# Patient Record
Sex: Male | Born: 1965 | Race: Black or African American | Hispanic: No | Marital: Married | Smoking: Never smoker
Health system: Southern US, Community
[De-identification: ages and names within clinical notes are randomized; demographics above are authoritative.]

## PROBLEM LIST (undated history)

## (undated) DIAGNOSIS — D649 Anemia, unspecified: Secondary | ICD-10-CM

## (undated) DIAGNOSIS — G8929 Other chronic pain: Secondary | ICD-10-CM

## (undated) DIAGNOSIS — T7840XA Allergy, unspecified, initial encounter: Secondary | ICD-10-CM

## (undated) DIAGNOSIS — IMO0002 Reserved for concepts with insufficient information to code with codable children: Secondary | ICD-10-CM

## (undated) DIAGNOSIS — R51 Headache: Secondary | ICD-10-CM

## (undated) DIAGNOSIS — K219 Gastro-esophageal reflux disease without esophagitis: Secondary | ICD-10-CM

## (undated) DIAGNOSIS — R519 Headache, unspecified: Secondary | ICD-10-CM

## (undated) HISTORY — DX: Other chronic pain: G89.29

## (undated) HISTORY — DX: Anemia, unspecified: D64.9

## (undated) HISTORY — DX: Reserved for concepts with insufficient information to code with codable children: IMO0002

## (undated) HISTORY — DX: Allergy, unspecified, initial encounter: T78.40XA

## (undated) HISTORY — PX: OTHER SURGICAL HISTORY: SHX169

## (undated) HISTORY — DX: Gastro-esophageal reflux disease without esophagitis: K21.9

## (undated) HISTORY — DX: Headache: R51

## (undated) HISTORY — DX: Headache, unspecified: R51.9

---

## 2008-11-19 ENCOUNTER — Encounter: Admission: RE | Admit: 2008-11-19 | Discharge: 2008-11-19 | Payer: Self-pay | Admitting: Internal Medicine

## 2011-01-27 ENCOUNTER — Encounter (INDEPENDENT_AMBULATORY_CARE_PROVIDER_SITE_OTHER): Payer: BC Managed Care – PPO | Admitting: Family Medicine

## 2011-01-27 ENCOUNTER — Other Ambulatory Visit: Payer: BC Managed Care – PPO

## 2011-01-27 DIAGNOSIS — N400 Enlarged prostate without lower urinary tract symptoms: Secondary | ICD-10-CM

## 2011-01-27 DIAGNOSIS — Z0000 Encounter for general adult medical examination without abnormal findings: Secondary | ICD-10-CM

## 2011-01-27 DIAGNOSIS — K219 Gastro-esophageal reflux disease without esophagitis: Secondary | ICD-10-CM

## 2011-02-18 ENCOUNTER — Ambulatory Visit (INDEPENDENT_AMBULATORY_CARE_PROVIDER_SITE_OTHER): Payer: BC Managed Care – PPO

## 2011-02-18 DIAGNOSIS — R05 Cough: Secondary | ICD-10-CM

## 2011-02-18 DIAGNOSIS — J029 Acute pharyngitis, unspecified: Secondary | ICD-10-CM

## 2011-02-18 DIAGNOSIS — R059 Cough, unspecified: Secondary | ICD-10-CM

## 2011-03-04 ENCOUNTER — Other Ambulatory Visit (INDEPENDENT_AMBULATORY_CARE_PROVIDER_SITE_OTHER): Payer: BC Managed Care – PPO

## 2011-03-04 DIAGNOSIS — D709 Neutropenia, unspecified: Secondary | ICD-10-CM

## 2011-05-04 ENCOUNTER — Telehealth: Payer: Self-pay

## 2011-05-04 NOTE — Telephone Encounter (Signed)
Med refill on protonic - trying for three days to get a refill  Call [phone]

## 2011-05-05 MED ORDER — PANTOPRAZOLE SODIUM 40 MG PO TBEC: 40.0000 mg | DELAYED_RELEASE_TABLET | Freq: Every day | ORAL | Status: DC

## 2011-05-05 NOTE — Telephone Encounter (Signed)
Sending Rx into [facility] pharmacy we have record of pt using) and LMOM that I am sending RFs there and to CB if he has any ?s or needed it to go to a different pharmacy.

## 2011-12-21 ENCOUNTER — Ambulatory Visit (INDEPENDENT_AMBULATORY_CARE_PROVIDER_SITE_OTHER): Payer: BC Managed Care – PPO | Admitting: Family Medicine

## 2011-12-21 VITALS — BP 118/80 | HR 78 | Temp 98.4°F | Resp 16 | Ht 70.0 in | Wt 168.2 lb

## 2011-12-21 DIAGNOSIS — H00019 Hordeolum externum unspecified eye, unspecified eyelid: Secondary | ICD-10-CM

## 2011-12-21 DIAGNOSIS — H00012 Hordeolum externum right lower eyelid: Secondary | ICD-10-CM

## 2011-12-21 NOTE — Progress Notes (Signed)
[facility] [address] [place] [address] [phone]  Date:  12/21/2011   Name:  Gary Dean   DOB:  [birth year]   MRN:  [id number]  PCP:  Tally Due, MD    Chief Complaint: Eye Pain   History of Present Illness:  Gary Dean is a 46 y.o. very pleasant male patient who presents with the following:  Today is Wednesday. On Monday evening he noted some discomfort in his right lower eye lid.  He did not see anything out of the ordinary until yesterday when it began to swell. The pain in his lid has gotten worse so he decided to be seen.   He has noted redness in the lower right conjunctival sac.   No crusting or discharge on his eye.   He wears glasses but not contacts- vision seems to be ok.   No unusual HA, vomiting, or photophobia  He is generally very healthy.    He has tried some eye drops that his wife had left over from a previous problem- tobramycin drops  There is no problem list on file for this patient.   Past Medical History  Diagnosis Date  . Allergy   . Anemia     No past surgical history on file.  History  Substance Use Topics  . Smoking status: Never Smoker   . Smokeless tobacco: Not on file  . Alcohol Use: Not on file    Family History  Problem Relation Age of Onset  . Asthma Mother   . Hypertension Mother     No Known Allergies  Medication list has been reviewed and updated.  Current Outpatient Prescriptions on File Prior to Visit  Medication Sig Dispense Refill  . cetirizine (ZYRTEC) 10 MG tablet Take 10 mg by mouth daily.      . pantoprazole (PROTONIX) 40 MG tablet Take 1 tablet (40 mg total) by mouth daily.  30 tablet  3    Review of Systems:  As per HPI- otherwise negative.   Physical Examination: Filed Vitals:   12/21/11 0813  BP: 118/80  Pulse: 78  Temp: 98.4 F (36.9 C)  Resp: 16   Filed Vitals:   12/21/11 0813  Height: 5\' 10"  (1.778 m)  Weight: 168 lb 3.2 oz (76.295 kg)   Body  mass index is 24.13 kg/(m^2). Ideal Body Weight: Weight in (lb) to have BMI = 25: 173.9    GEN: WDWN, NAD, Non-toxic, Alert & Oriented x 3, looks well HEENT: Atraumatic, Normocephalic.  Bilateral TM wnl, oropharynx normal.  PEERL,EOMI.   Right lower lid is slightly swollen with a visible stye head inside the lid.  Globe is non- tender, fundoscopic exam normal.  No pain with movement of eye in all directions.   Ears and Nose: No external deformity. EXTR: No clubbing/cyanosis/edema NEURO: Normal gait.  PSYCH: Normally interactive. Conversant. Not depressed or anxious appearing.  Calm demeanor.    Assessment and Plan: 1. Hordeolum externum of right lower eyelid    Counseled re: conservative treatment of stye.  He has some left-over tobramycin drops which he can use up to every 4 hours as needed.  Let me know if not better in the next couple of days- Sooner if worse.   Abbe Amsterdam, MD

## 2011-12-21 NOTE — Patient Instructions (Addendum)
Let me know if you are not better in the next couple of days- Sooner if worse.     Sty A sty (hordeolum) is an infection of a gland in the eyelid located at the base of the eyelash. A sty may develop a white or yellow head of pus. It can be puffy (swollen). Usually, the sty will burst and pus will come out on its own. They do not leave lumps in the eyelid once they drain. A sty is often confused with another form of cyst of the eyelid called a chalazion. Chalazions occur within the eyelid and not on the edge where the bases of the eyelashes are. They often are red, sore and then form firm lumps in the eyelid. CAUSES   Germs (bacteria).  Lasting (chronic) eyelid inflammation. SYMPTOMS   Tenderness, redness and swelling along the edge of the eyelid at the base of the eyelashes.  Sometimes, there is a white or yellow head of pus. It may or may not drain. DIAGNOSIS  An ophthalmologist will be able to distinguish between a sty and a chalazion and treat the condition appropriately.  TREATMENT   Styes are typically treated with warm packs (compresses) until drainage occurs.  In rare cases, medicines that kill germs (antibiotics) may be prescribed. These antibiotics may be in the form of drops, cream or pills.  If a hard lump has formed, it is generally necessary to do a small incision and remove the hardened contents of the cyst in a minor surgical procedure done in the office.  In suspicious cases, your caregiver may send the contents of the cyst to the lab to be certain that it is not a rare, but dangerous form of cancer of the glands of the eyelid. HOME CARE INSTRUCTIONS   Wash your hands often and dry them with a clean towel. Avoid touching your eyelid. This may spread the infection to other parts of the eye.  Apply heat to your eyelid for 10 to 20 minutes, several times a day, to ease pain and help to heal it faster.  Do not squeeze the sty. Allow it to drain on its own. Wash your  eyelid carefully 3 to 4 times per day to remove any pus. SEEK IMMEDIATE MEDICAL CARE IF:   Your eye becomes painful or puffy (swollen).  Your vision changes.  Your sty does not drain by itself within 3 days.  Your sty comes back within a short period of time, even with treatment.  You have redness (inflammation) around the eye.  You have a fever. Document Released: 11/17/2004 Document Revised: 05/02/2011 Document Reviewed: 07/22/2008 [facility] Patient Information 2013 [place], [place].

## 2012-01-06 ENCOUNTER — Ambulatory Visit (INDEPENDENT_AMBULATORY_CARE_PROVIDER_SITE_OTHER): Payer: BC Managed Care – PPO | Admitting: Family Medicine

## 2012-01-06 VITALS — BP 120/76 | HR 86 | Temp 98.3°F | Resp 16 | Ht 70.0 in | Wt 166.8 lb

## 2012-01-06 DIAGNOSIS — R42 Dizziness and giddiness: Secondary | ICD-10-CM

## 2012-01-06 DIAGNOSIS — R519 Headache, unspecified: Secondary | ICD-10-CM

## 2012-01-06 DIAGNOSIS — R51 Headache: Secondary | ICD-10-CM

## 2012-01-06 NOTE — Progress Notes (Signed)
[facility]:  Office Visit  Chief Complaint:  Chief Complaint  Patient presents with  . Dizziness    got up to use the bathroom this morning was off balance and dizzy    HPI: Gary Dean is a 46 y.o. male who complains of  Off balance and dizzy when got out of bed this AM, with right sided pressure, dull ache described as  light sense of something . He has a h/o HA but not like this. Dizziness lasted for about 20 minutes.  No prior episodes. Denies vision changes, CP, SOB, palpitations, diaphoresis, confusion, LOC, gait changes, nause, vomting, abd pain., numbness, tingling, jaw pain. He did not have worsening sxs after urination. Able to get off toilet ok without increasing dizziness or LOC. Deneis URI sxs, inner ear issues.   Past Medical History  Diagnosis Date  . Allergy   . Anemia   . Ulcer   . Chronic headaches     3-4 times a day   Past Surgical History  Procedure Date  . Left knee arthroscopy    History   Social History  . Marital Status: Married    Spouse Name: N/A    Number of Children: N/A  . Years of Education: N/A   Social History Main Topics  . Smoking status: Never Smoker   . Smokeless tobacco: None  . Alcohol Use: None  . Drug Use: None  . Sexually Active: None   Other Topics Concern  . None   Social History Narrative  . None   Family History  Problem Relation Age of Onset  . Asthma Mother   . Hypertension Mother    No Known Allergies Prior to Admission medications   Medication Sig Start Date End Date Taking? Authorizing Provider  cetirizine (ZYRTEC) 10 MG tablet Take 10 mg by mouth daily.   Yes Historical Provider, MD  pantoprazole (PROTONIX) 40 MG tablet Take 1 tablet (40 mg total) by mouth daily. 05/05/11 05/04/12 Yes Maurice March, MD     ROS: The patient denies fevers, chills, night sweats, unintentional weight loss, chest pain, palpitations, wheezing, dyspnea on exertion, nausea, vomiting, abdominal pain,  dysuria, hematuria, melena, numbness, weakness, or tingling.   All other systems have been reviewed and were otherwise negative with the exception of those mentioned in the HPI and as above.    PHYSICAL EXAM: Filed Vitals:   01/06/12 0842  BP: 120/76  Pulse: 86  Temp: 98.3 F (36.8 C)  Resp: 16   Filed Vitals:   01/06/12 0842  Height: 5\' 10"  (1.778 m)  Weight: 166 lb 12.8 oz (75.66 kg)   Body mass index is 23.93 kg/(m^2). Laying down 112/76, 72; 108/76, 68; 112/78,   General: Alert, no acute distress HEENT:  Normocephalic, atraumatic, oropharynx patent. EOMI, PERRLA, fundoscopic exam nl. TM nl.  Cardiovascular:  Regular rate and rhythm, no rubs murmurs or gallops.  No Carotid bruits, radial pulse intact. No pedal edema.  Respiratory: Clear to auscultation bilaterally.  No wheezes, rales, or rhonchi.  No cyanosis, no use of accessory musculature GI: No organomegaly, abdomen is soft and non-tender, positive bowel sounds.  No masses. Skin: No rashes. Neurologic: Facial musculature symmetric. CN 2-12 grossly normal Psychiatric: Patient is appropriate throughout our interaction. Lymphatic: No cervical lymphadenopathy Musculoskeletal: Gait intact. Cerebellar fxn intact   LABS: No results found for this or any previous visit.   EKG/XRAY:   Primary read interpreted by Dr. Conley Rolls at [facility].   ASSESSMENT/PLAN:  Encounter Diagnosis  Name Primary?  . 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  ? Postural related vs other pre-syncope vasovagal triggers ie stress, dehydration. Orthostatics normal. Patient declined CBC, glucose. Will check next time if reoccurs.  Not likely solely neurogenic ie brain mass, cardiac arrhythmia or infectious related Continue to monitor for sxs. Get up slowly when rising from bed early in AM F/u with worsening sxs, would consider EKG and cardiac follow-up    Deyanira Fesler PHUONG,  DO 01/06/2012 9:23 AM

## 2012-01-06 NOTE — Patient Instructions (Signed)
Dizziness  Dizziness is a common problem. It is a feeling of unsteadiness or lightheadedness. You may feel like you are about to faint. Dizziness can lead to injury if you stumble or fall. A person of any age group can suffer from dizziness, but dizziness is more common in older adults.  CAUSES    Dizziness can be caused by many different things, including:   Middle ear problems.   Standing for too long.   Infections.   An allergic reaction.   Aging.   An emotional response to something, such as the sight of blood.   Side effects of medicines.   Fatigue.   Problems with circulation or blood pressure.   Excess use of alcohol, medicines, or illegal drug use.   Breathing too fast (hyperventilation).   An arrhythmia or problems with your heart rhythm.   Low red blood cell count (anemia).   Pregnancy.   Vomiting, diarrhea, fever, or other illnesses that cause dehydration.   Diseases or conditions such as Parkinson's disease, high blood pressure (hypertension), diabetes, and thyroid problems.   Exposure to extreme heat.  DIAGNOSIS    To find the cause of your dizziness, your caregiver may do a physical exam, lab tests, radiologic imaging scans, or an electrocardiography test (ECG).    TREATMENT    Treatment of dizziness depends on the cause of your symptoms and can vary greatly.  HOME CARE INSTRUCTIONS     Drink enough fluids to keep your urine clear or pale yellow. This is especially important in very hot weather. In the elderly, it is also important in cold weather.   If your dizziness is caused by medicines, take them exactly as directed. When taking blood pressure medicines, it is especially important to get up slowly.   Rise slowly from chairs and steady yourself until you feel okay.   In the morning, first sit up on the side of the bed. When this seems okay, stand slowly while holding onto something until you know your balance is fine.    If you need to stand in one place for a long time, be sure to move your legs often. Tighten and relax the muscles in your legs while standing.   If dizziness continues to be a problem, have someone stay with you for a day or two. Do this until you feel you are well enough to stay alone. Have the person call your caregiver if he or she notices changes in you that are concerning.   Do not drive or use heavy machinery if you feel dizzy.   Do not drink alcohol.  SEEK IMMEDIATE MEDICAL CARE IF:     Your dizziness or lightheadedness gets worse.   You feel nauseous or vomit.   You develop problems with talking, walking, weakness, or using your arms, hands, or legs.   You are not thinking clearly or you have difficulty forming sentences. It may take a friend or family member to determine if your thinking is normal.   You develop chest pain, abdominal pain, shortness of breath, or sweating.   Your vision changes.   You notice any bleeding.   You have side effects from medicine that seems to be getting worse rather than better.  MAKE SURE YOU:     Understand these instructions.   Will watch your condition.   Will get help right away if you are not doing well or get worse.  Document Released: 08/03/2000 Document Revised: 05/02/2011 Document Reviewed: 08/27/2010  ExitCare

## 2012-02-10 ENCOUNTER — Ambulatory Visit (INDEPENDENT_AMBULATORY_CARE_PROVIDER_SITE_OTHER): Payer: BC Managed Care – PPO | Admitting: Family Medicine

## 2012-02-10 VITALS — BP 118/80 | HR 81 | Temp 98.1°F | Resp 16 | Ht 69.0 in | Wt 164.0 lb

## 2012-02-10 DIAGNOSIS — Z23 Encounter for immunization: Secondary | ICD-10-CM

## 2012-02-10 DIAGNOSIS — Z293 Encounter for prophylactic fluoride administration: Secondary | ICD-10-CM

## 2012-02-10 DIAGNOSIS — Z0000 Encounter for general adult medical examination without abnormal findings: Secondary | ICD-10-CM

## 2012-02-10 LAB — COMPREHENSIVE METABOLIC PANEL
AST: 21 U/L (ref 0–37)
BUN: 13 mg/dL (ref 6–23)
CO2: 27 mEq/L (ref 19–32)
Calcium: 10.1 mg/dL (ref 8.4–10.5)
Chloride: 104 mEq/L (ref 96–112)
Creat: 1.02 mg/dL (ref 0.50–1.35)

## 2012-02-10 LAB — POCT CBC
MCH, POC: 28.3 pg (ref 27–31.2)
MCV: 91.2 fL (ref 80–97)
MID (cbc): 0.4 (ref 0–0.9)
Platelet Count, POC: 192 10*3/uL (ref 142–424)
RBC: 5.59 M/uL (ref 4.69–6.13)
WBC: 3.7 10*3/uL — AB (ref 4.6–10.2)

## 2012-02-10 LAB — LIPID PANEL
Cholesterol: 163 mg/dL (ref 0–200)
HDL: 48 mg/dL (ref >39)
Total CHOL/HDL Ratio: 3.4 Ratio
Triglycerides: 96 mg/dL (ref <150)

## 2012-02-10 LAB — POCT URINALYSIS DIPSTICK
Bilirubin, UA: NEGATIVE
Glucose, UA: NEGATIVE
Ketones, UA: NEGATIVE
Leukocytes, UA: NEGATIVE
Protein, UA: NEGATIVE

## 2012-02-10 MED ORDER — KETOCONAZOLE 2 % EX SHAM: MEDICATED_SHAMPOO | CUTANEOUS | Status: DC

## 2012-02-10 MED ORDER — PANTOPRAZOLE SODIUM 40 MG PO TBEC: 40.0000 mg | DELAYED_RELEASE_TABLET | Freq: Every day | ORAL | Status: DC

## 2012-02-10 NOTE — Progress Notes (Signed)
Complete physical examination  History: Patient is here for his annual physical examination. No particular new problems. Images time for him to get a good checkup.  Past medical history: He's had problems with allergies which he intermittently treat with Zyrtec. He has a history of ulcers, which leads him to take some medicine for stomach or apparent basis now when he noticed his symptoms. He does have a remote history of anemia but not backward he is in college. That is not an ongoing problem.  Previous surgeries: He had knee surgery to remove some bone fragments. No other major operations  Allergies: None Current medications: Pantoprazole daily when necessary  Zyrtec daily when necessary  Family history: Parents and siblings are all living there is family history for osteoporosis, hypertension, hyperlipidemia, and asthma.  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. He is married, has several children. A 44 year old still at home. He does not smoke drink or use drugs. He exercises several times a week by walking. He is originally from [place]. Review of systems: Constitutional: Unremarkable HEENT: Unremarkable Eyes: Unremarkable Respiratory: Unremarkable Cardiovascular: Unremarkable GI: Occasional epigastric pain he is taking his medication again GU: Unremarkable Muscle skeletal: Unremarkable Dermatologic: Has had problems of his scalp and would like some more Nizoral shampoo to be used use. Neurologic unremarkable  Physical exam: Well-developed well-nourished man in no acute distress. TMs normal. Wears glasses. Eyes PERRLA. Fundi benign with discs flat. Throat clear. Neck supple without nodes thyromegaly. Chest clear. Heart regular without murmurs gallops or arrhythmias. Abdomen soft without masses tenderness. Normal male external genitalia uncircumcised. Testes descended. No hernias. No axillary inguinal nodes.  Extremities unremarkable. Skin unremarkable. Good pedal pulses.  Assessment: Complete physical examination History of peptic ulcer disease Seborrheic dermatitis of scalp History of allergic rhinitis   Plan: Check EKG UA PSA Hemoccult complete metabolic panel and CBC Refills of his medications  Results for orders placed in visit on 02/10/12  POCT URINALYSIS DIPSTICK      Component Value Range   Color, UA dark yellow     Clarity, UA clear     Glucose, UA neg     Bilirubin, UA neg     Ketones, UA neg     Spec Grav, UA 1.020     Blood, UA neg     pH, UA 7.0     Protein, UA neg     Urobilinogen, UA 1.0     Nitrite, UA neg     Leukocytes, UA Negative    POCT CBC      Component Value Range   WBC 3.7 (*) 4.6 - 10.2 K/uL   Lymph, poc 1.7  0.6 - 3.4   POC LYMPH PERCENT 47.0  10 - 50 %L   MID (cbc) 0.4  0 - 0.9   POC MID % 10.1  0 - 12 %M   POC Granulocyte 1.6 (*) 2 - 6.9   Granulocyte percent 42.9  37 - 80 %G   RBC 5.59  4.69 - 6.13 M/uL   Hemoglobin 15.8  14.1 - 18.1 g/dL   HCT, POC 45.4  09.8 - 53.7 %   MCV 91.2  80 - 97 fL   MCH, POC 28.3  27 - 31.2 pg   MCHC 31.0 (*) 31.8 - 35.4 g/dL   RDW, POC 11.9     Platelet Count, POC 192  142 - 424 K/uL   MPV 12.0  0 - 99.8 fL  IFOBT (OCCULT BLOOD)      Component Value Range   IFOBT Negative

## 2012-02-10 NOTE — Patient Instructions (Signed)
We will let you know the results of the remainder of your laboratory data.  Return if further problems arise.

## 2012-02-12 ENCOUNTER — Encounter: Payer: Self-pay | Admitting: *Deleted

## 2012-06-07 ENCOUNTER — Telehealth: Payer: Self-pay

## 2012-06-07 NOTE — Telephone Encounter (Signed)
Pt is calling because he is in need of pre authorization for some medication. He states that there should already be something faxed to [place], he is wanting to check  And see how its going, Call back number is [phone]

## 2012-06-07 NOTE — Telephone Encounter (Signed)
LMOM for pt that we have not received anything from pharmacy concerning a PA needed. Asked for him to call his pharmacy and have them fax [place] info, or to call and let us know what he needs.

## 2012-06-07 NOTE — Telephone Encounter (Signed)
Pt CB and reported that he needs a PA on his protonix. His [facility] plan ID is [phone]. He states he has used Zantac when his Sxs are not as bad and has tried/failed Prevacid in the past. He was put on protonix 8 years ago after gastric ulcer and has had good effectiveness from it.

## 2012-06-08 NOTE — Telephone Encounter (Signed)
Tried to complete PA online. It would not process online but will send fax to complete. Awaiting form.

## 2012-06-13 NOTE — Progress Notes (Signed)
PA approved for pantoprazole through 06/12/13. Faxed to pharmacy.

## 2012-06-20 NOTE — Telephone Encounter (Signed)
PA approved see "documentation" notes.

## 2013-01-28 ENCOUNTER — Ambulatory Visit (INDEPENDENT_AMBULATORY_CARE_PROVIDER_SITE_OTHER): Payer: BC Managed Care – PPO | Admitting: Family Medicine

## 2013-01-28 VITALS — BP 110/80 | HR 89 | Temp 98.6°F | Resp 16 | Ht 70.0 in | Wt 168.8 lb

## 2013-01-28 DIAGNOSIS — Z8711 Personal history of peptic ulcer disease: Secondary | ICD-10-CM

## 2013-01-28 DIAGNOSIS — L219 Seborrheic dermatitis, unspecified: Secondary | ICD-10-CM

## 2013-01-28 DIAGNOSIS — L218 Other seborrheic dermatitis: Secondary | ICD-10-CM

## 2013-01-28 DIAGNOSIS — Z0000 Encounter for general adult medical examination without abnormal findings: Secondary | ICD-10-CM

## 2013-01-28 LAB — POCT CBC
Granulocyte percent: 42.2 %G (ref 37–80)
HCT, POC: 48.9 % (ref 43.5–53.7)
Hemoglobin: 15.2 g/dL (ref 14.1–18.1)
Lymph, poc: 1.7 (ref 0.6–3.4)
MCH, POC: 28.4 pg (ref 27–31.2)
MCHC: 31.1 g/dL — AB (ref 31.8–35.4)
MCV: 91.2 fL (ref 80–97)
MID (cbc): 0.4 (ref 0–0.9)
MPV: 10.8 fL (ref 0–99.8)
POC Granulocyte: 1.5 — AB (ref 2–6.9)
POC LYMPH PERCENT: 47 %L (ref 10–50)
POC MID %: 10.8 %M (ref 0–12)
Platelet Count, POC: 166 10*3/uL (ref 142–424)
RBC: 5.36 M/uL (ref 4.69–6.13)
RDW, POC: 11.9 %
WBC: 3.6 10*3/uL — AB (ref 4.6–10.2)

## 2013-01-28 LAB — POCT URINALYSIS DIPSTICK
Bilirubin, UA: NEGATIVE
Glucose, UA: NEGATIVE
Ketones, UA: NEGATIVE
Leukocytes, UA: NEGATIVE
Nitrite, UA: NEGATIVE
Protein, UA: 30
Spec Grav, UA: 1.02
Urobilinogen, UA: 1
pH, UA: 7

## 2013-01-28 LAB — LIPID PANEL
Cholesterol: 174 mg/dL (ref 0–200)
HDL: 49 mg/dL (ref >39)
LDL Cholesterol: 109 mg/dL — ABNORMAL HIGH (ref 0–99)
Total CHOL/HDL Ratio: 3.6 Ratio
Triglycerides: 82 mg/dL (ref <150)
VLDL: 16 mg/dL (ref 0–40)

## 2013-01-28 LAB — IFOBT (OCCULT BLOOD): IFOBT: NEGATIVE

## 2013-01-28 LAB — PSA: PSA: 3.25 ng/mL (ref <=4.00)

## 2013-01-28 MED ORDER — KETOCONAZOLE 2 % EX SHAM: MEDICATED_SHAMPOO | CUTANEOUS | Status: DC

## 2013-01-28 MED ORDER — PANTOPRAZOLE SODIUM 40 MG PO TBEC: 40.0000 mg | DELAYED_RELEASE_TABLET | Freq: Every day | ORAL | Status: DC

## 2013-01-28 NOTE — Patient Instructions (Signed)
Please check your records for last tetanus shot.  We have no record of one being given here  Health Maintenance, Males A healthy lifestyle and preventative care can promote health and wellness.  Maintain regular health, dental, and eye exams.  Eat a healthy diet. Foods like vegetables, fruits, whole grains, low-fat dairy products, and lean protein foods contain the nutrients you need without too many calories. Decrease your intake of foods high in solid fats, added sugars, and salt. Get information about a proper diet from your caregiver, if necessary.  Regular physical exercise is one of the most important things you can do for your health. Most adults should get at least 150 minutes of moderate-intensity exercise (any activity that increases your heart rate and causes you to sweat) each week. In addition, most adults need muscle-strengthening exercises on 2 or more days a week.   Maintain a healthy weight. The body mass index (BMI) is a screening tool to identify possible weight problems. It provides an estimate of body fat based on height and weight. Your caregiver can help determine your BMI, and can help you achieve or maintain a healthy weight. For adults 20 years and older:  A BMI below 18.5 is considered underweight.  A BMI of 18.5 to 24.9 is normal.  A BMI of 25 to 29.9 is considered overweight.  A BMI of 30 and above is considered obese.  Maintain normal blood lipids and cholesterol by exercising and minimizing your intake of saturated fat. Eat a balanced diet with plenty of fruits and vegetables. Blood tests for lipids and cholesterol should begin at age 67 and be repeated every 5 years. If your lipid or cholesterol levels are high, you are over 50, or you are a high risk for heart disease, you may need your cholesterol levels checked more frequently.Ongoing high lipid and cholesterol levels should be treated with medicines, if diet and exercise are not effective.  If you smoke,  find out from your caregiver how to quit. If you do not use tobacco, do not start.  Lung cancer screening is recommended for adults aged 71 80 years who are at high risk for developing lung cancer because of a history of smoking. Yearly low-dose computed tomography (CT) is recommended for people who have at least a 30-pack-year history of smoking and are a current smoker or have quit within the past 15 years. A pack year of smoking is smoking an average of 1 pack of cigarettes a day for 1 year (for example: 1 pack a day for 30 years or 2 packs a day for 15 years). Yearly screening should continue until the smoker has stopped smoking for at least 15 years. Yearly screening should also be stopped for people who develop a health problem that would prevent them from having lung cancer treatment.  If you choose to drink alcohol, do not exceed 2 drinks per day. One drink is considered to be 12 ounces (355 mL) of beer, 5 ounces (148 mL) of wine, or 1.5 ounces (44 mL) of liquor.  Avoid use of street drugs. Do not share needles with anyone. Ask for help if you need support or instructions about stopping the use of drugs.  High blood pressure causes heart disease and increases the risk of stroke. Blood pressure should be checked at least every 1 to 2 years. Ongoing high blood pressure should be treated with medicines if weight loss and exercise are not effective.  If you are 50 to 45 years old,  ask your caregiver if you should take aspirin to prevent heart disease.  Diabetes screening involves taking a blood sample to check your fasting blood sugar level. This should be done once every 3 years, after age 42, if you are within normal weight and without risk factors for diabetes. Testing should be considered at a younger age or be carried out more frequently if you are overweight and have at least 1 risk factor for diabetes.  Colorectal cancer can be detected and often prevented. Most routine colorectal cancer  screening begins at the age of 26 and continues through age 72. However, your caregiver may recommend screening at an earlier age if you have risk factors for colon cancer. On a yearly basis, your caregiver may provide home test kits to check for hidden blood in the stool. Use of a small camera at the end of a tube, to directly examine the colon (sigmoidoscopy or colonoscopy), can detect the earliest forms of colorectal cancer. Talk to your caregiver about this at age 57, when routine screening begins. Direct examination of the colon should be repeated every 5 to 10 years through age 20, unless early forms of pre-cancerous polyps or small growths are found.  Hepatitis C blood testing is recommended for all people born from [birth year] through 1965 and any individual with known risks for hepatitis C.  Healthy men should no longer receive prostate-specific antigen (PSA) blood tests as part of routine cancer screening. Consult with your caregiver about prostate cancer screening.  Testicular cancer screening is not recommended for adolescents or adult males who have no symptoms. Screening includes self-exam, caregiver exam, and other screening tests. Consult with your caregiver about any symptoms you have or any concerns you have about testicular cancer.  Practice safe sex. Use condoms and avoid high-risk sexual practices to reduce the spread of sexually transmitted infections (STIs).  Use sunscreen. Apply sunscreen liberally and repeatedly throughout the day. You should seek shade when your shadow is shorter than you. Protect yourself by wearing long sleeves, pants, a wide-brimmed hat, and sunglasses year round, whenever you are outdoors.  Notify your caregiver of new moles or changes in moles, especially if there is a change in shape or color. Also notify your caregiver if a mole is larger than the size of a pencil eraser.  A one-time screening for abdominal aortic aneurysm (AAA) and surgical repair of large  AAAs by sound wave imaging (ultrasonography) is recommended for ages 69 to 39 years who are current or former smokers.  Stay current with your immunizations. Document Released: 08/06/2007 Document Revised: 06/04/2012 Document Reviewed: 07/05/2010 [facility] Patient Information 2014 [place], [place].

## 2013-01-28 NOTE — Progress Notes (Signed)
Patient ID: Gary Dean, male   DOB: August 09, 1965, 47 y.o.   MRN: [id number]  This chart was scribed for Elvina Sidle, MD by Caryn Bee, Medical Scribe. The patient's care was started at 8:50 AM.  Patient ID: Gary Dean MRN: [id number], DOB: 29-Mar-1965 47 y.o. Date of Encounter: 01/28/2013, 8:50 AM  Primary Physician: Tally Due, MD  Chief Complaint: Physical (CPE)  HPI 47 y.o. y/o male with history noted below here for CPE. 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 He is married with an 5 yr old child and a 46 yr old. His 82 yr old son lives on his own and works at [employer]. His daughter is applying to college currently. She currently goes to [employer], 4.6 GPA.  Pt had a stomach ulcer years ago that he takes medication for occasionally when he has a flare up. He is going to start exercising again in January. Pt is not a smoker. He is unsure when his last tetanus shot was. Pt went to [facility] and is originally from [place].  Doing well. No issues/complaints.:   Review of Systems: Consitutional: No fever, chills, fatigue, night sweats, lymphadenopathy, or weight changes. Eyes: No visual changes, eye redness, or discharge. ENT/Mouth: Ears: No otalgia, tinnitus, hearing loss, discharge. Nose: No congestion, rhinorrhea, sinus pain, or epistaxis. Throat: No sore throat, post nasal drip, or teeth pain. Cardiovascular: No CP, palpitations, diaphoresis, DOE, edema, orthopnea, PND. Respiratory: No cough, hemoptysis, SOB, or wheezing. Gastrointestinal: No anorexia, dysphagia, reflux, pain, nausea, vomiting, hematemesis, diarrhea, constipation, BRBPR, or melena. Genitourinary: No dysuria, frequency, urgency, hematuria, incontinence, nocturia, decreased urinary stream, discharge, impotence, or testicular pain/masses. Musculoskeletal: No decreased ROM, myalgias, stiffness, joint swelling, or weakness. Skin: No rash, erythema, lesion  changes, pain, warmth, jaundice, or pruritis. Neurological: No headache, dizziness, syncope, seizures, tremors, memory loss, coordination problems, or paresthesias. Psychological: No anxiety, depression, hallucinations, SI/HI. Endocrine: No fatigue, polydipsia, polyphagia, polyuria, or known diabetes. All other systems were reviewed and are otherwise negative.  Past Medical History  Diagnosis Date   Allergy    Anemia    Ulcer    Chronic headaches     3-4 times a day     Past Surgical History  Procedure Laterality Date   Left knee arthroscopy      Home Meds:  Prior to Admission medications   Medication Sig Start Date End Date Taking? Authorizing Provider  cetirizine (ZYRTEC) 10 MG tablet Take 10 mg by mouth daily.   Yes Historical Provider, MD  ketoconazole (NIZORAL) 2 % shampoo Apply topically 2 (two) times a week. 02/10/12  Yes Peyton Najjar, MD  pantoprazole (PROTONIX) 40 MG tablet Take 1 tablet (40 mg total) by mouth daily. 02/10/12 02/09/13 Yes Peyton Najjar, MD    Allergies: No Known Allergies  History   Social History   Marital Status: Married    Spouse Name: N/A    Number of Children: N/A   Years of Education: N/A   Occupational History   Not on file.   Social History Main Topics   Smoking status: Never Smoker    Smokeless tobacco: Not on file   Alcohol Use: No   Drug Use: No   Sexual Activity: Not on file   Other Topics Concern   Not on file   Social History Narrative   No narrative on file    Family History  Problem Relation Age of Onset   Asthma Mother  Hypertension Mother     Physical Exam: Blood pressure 110/80, pulse 89, temperature 98.6 F (37 C), temperature source Oral, resp. rate 16, height 5\' 10"  (1.778 m), weight 168 lb 12.8 oz (76.567 kg), SpO2 100.00%.  General: Well developed, well nourished, in no acute distress. HEENT: Normocephalic, atraumatic. Conjunctiva pink, sclera non-icteric. Pupils 2 mm constricting  to 1 mm, round, regular, and equally reactive to light and accomodation. EOMI. Internal auditory canal clear. TMs with good cone of light and without pathology. Nasal mucosa pink. Nares are without discharge. No sinus tenderness. Oral mucosa pink. Dentition good. Pharynx without exudate.   Neck: Supple. Trachea midline. No thyromegaly. Full ROM. No lymphadenopathy. Lungs: Clear to auscultation bilaterally without wheezes, rales, or rhonchi. Breathing is of normal effort and unlabored. Cardiovascular: RRR with S1 S2. No murmurs, rubs, or gallops appreciated. Distal pulses 2+ symmetrically. No carotid or abdominal bruits Abdomen: Soft, non-tender, non-distended with normoactive bowel sounds. No hepatosplenomegaly or masses. No rebound/guarding. No CVA tenderness. Without hernias.  Rectal: No external hemorrhoids or fissures. Rectal vault without masses.  Genitourinary:  circumcised male. No penile lesions. Testes descended bilaterally, and smooth without tenderness or masses.  Musculoskeletal: Full range of motion and 5/5 strength throughout. Without swelling, atrophy, tenderness, crepitus, or warmth. Extremities without clubbing, cyanosis, or edema. Calves supple. Skin: Warm and moist without erythema, ecchymosis, wounds, or rash. Neuro: A+Ox3. CN II-XII grossly intact. Moves all extremities spontaneously. Full sensation throughout. Normal gait. DTR 2+ throughout upper and lower extremities. Finger to nose intact. Psych:  Responds to questions appropriately with a normal affect.   Studies: CBC, CMET, Lipid, PSA, TSH, Vitamin D all pending.  Results for orders placed in visit on 01/28/13  POCT CBC      Result Value Range   WBC 3.6 (*) 4.6 - 10.2 K/uL   Lymph, poc 1.7  0.6 - 3.4   POC LYMPH PERCENT 47.0  10 - 50 %L   MID (cbc) 0.4  0 - 0.9   POC MID % 10.8  0 - 12 %M   POC Granulocyte 1.5 (*) 2 - 6.9   Granulocyte percent 42.2  37 - 80 %G   RBC 5.36  4.69 - 6.13 M/uL   Hemoglobin 15.2  14.1 -  18.1 g/dL   HCT, POC 40.9  81.1 - 53.7 %   MCV 91.2  80 - 97 fL   MCH, POC 28.4  27 - 31.2 pg   MCHC 31.1 (*) 31.8 - 35.4 g/dL   RDW, POC 91.4     Platelet Count, POC 166  142 - 424 K/uL   MPV 10.8  0 - 99.8 fL  IFOBT (OCCULT BLOOD)      Result Value Range   IFOBT Negative    POCT URINALYSIS DIPSTICK      Result Value Range   Color, UA yellow     Clarity, UA clear     Glucose, UA neg     Bilirubin, UA neg     Ketones, UA neg     Spec Grav, UA 1.020     Blood, UA trace-intact     pH, UA 7.0     Protein, UA 30     Urobilinogen, UA 1.0     Nitrite, UA neg     Leukocytes, UA Negative       Assessment/Plan:  47 y.o. y/o  male here for CPE -Routine general medical examination at a health care facility - Plan: POCT CBC, Lipid panel, PSA,  IFOBT POC (occult bld, rslt in office), POCT urinalysis dipstick  H/O peptic ulcer - Plan: pantoprazole (PROTONIX) 40 MG tablet  Seborrheic dermatitis of scalp - Plan: ketoconazole (NIZORAL) 2 % shampoo    Signed, Elvina Sidle, MD 01/28/2013 8:50 AM

## 2013-08-15 ENCOUNTER — Telehealth: Payer: Self-pay

## 2013-08-15 NOTE — Telephone Encounter (Signed)
PA needed for pantoprazole. Completed on phone. Approved through 08/15/14, case # [id number]. Notified pharm.

## 2013-08-19 ENCOUNTER — Telehealth: Payer: Self-pay | Admitting: *Deleted

## 2013-08-19 NOTE — Telephone Encounter (Signed)
PA has been approved for Pantoprazole until 08/15/14

## 2013-12-19 ENCOUNTER — Ambulatory Visit (INDEPENDENT_AMBULATORY_CARE_PROVIDER_SITE_OTHER): Payer: BC Managed Care – PPO | Admitting: Family Medicine

## 2013-12-19 VITALS — BP 122/72 | HR 83 | Temp 98.6°F | Resp 17 | Ht 71.0 in | Wt 170.0 lb

## 2013-12-19 DIAGNOSIS — H00016 Hordeolum externum left eye, unspecified eyelid: Secondary | ICD-10-CM

## 2013-12-19 MED ORDER — TOBRAMYCIN 0.3 % OP OINT: 1.0000 "application " | TOPICAL_OINTMENT | Freq: Three times a day (TID) | OPHTHALMIC | Status: DC

## 2013-12-19 NOTE — Patient Instructions (Addendum)
Both internal and external hordeola can be treated with warm compresses, placed off and on for about 15 minutes at a time approximately four times per day.  Apply ointment 2-3x/day   Sty A sty (hordeolum) is an infection of a gland in the eyelid located at the base of the eyelash. A sty may develop a white or yellow head of pus. It can be puffy (swollen). Usually, the sty will burst and pus will come out on its own. They do not leave lumps in the eyelid once they drain. A sty is often confused with another form of cyst of the eyelid called a chalazion. Chalazions occur within the eyelid and not on the edge where the bases of the eyelashes are. They often are red, sore and then form firm lumps in the eyelid. CAUSES   Germs (bacteria).  Lasting (chronic) eyelid inflammation. SYMPTOMS   Tenderness, redness and swelling along the edge of the eyelid at the base of the eyelashes.  Sometimes, there is a white or yellow head of pus. It may or may not drain. DIAGNOSIS  An ophthalmologist will be able to distinguish between a sty and a chalazion and treat the condition appropriately.  TREATMENT   Styes are typically treated with warm packs (compresses) until drainage occurs.  In rare cases, medicines that kill germs (antibiotics) may be prescribed. These antibiotics may be in the form of drops, cream or pills.  If a hard lump has formed, it is generally necessary to do a small incision and remove the hardened contents of the cyst in a minor surgical procedure done in the office.  In suspicious cases, your caregiver may send the contents of the cyst to the lab to be certain that it is not a rare, but dangerous form of cancer of the glands of the eyelid. HOME CARE INSTRUCTIONS   Wash your hands often and dry them with a clean towel. Avoid touching your eyelid. This may spread the infection to other parts of the eye.  Apply heat to your eyelid for 10 to 20 minutes, several times a day, to ease  pain and help to heal it faster.  Do not squeeze the sty. Allow it to drain on its own. Wash your eyelid carefully 3 to 4 times per day to remove any pus. SEEK IMMEDIATE MEDICAL CARE IF:   Your eye becomes painful or puffy (swollen).  Your vision changes.  Your sty does not drain by itself within 3 days.  Your sty comes back within a short period of time, even with treatment.  You have redness (inflammation) around the eye.  You have a fever. Document Released: 11/17/2004 Document Revised: 05/02/2011 Document Reviewed: 05/24/2013 [facility] Patient Information 2015 [place] [place]. This information is not intended to replace advice given to you by your health care provider. Make sure you discuss any questions you have with your health care provider.

## 2013-12-19 NOTE — Progress Notes (Signed)
   Subjective:    Patient ID: Gary Dean, male    DOB: 08/10/1965, 48 y.o.   MRN: [id number]  HPI  48 year old male with allergies to ragweed [facility].  This began 3 days ago, when he was experiencing some eye tenderness that night with some filming across the eye coming intermittently.  By the next morning, he noticed that the eye was swollen.  He had some discharge from his left eye that morning.  The film had completely resolved.  He has no fever, photosensitivity, pain to the eye.  He reports no trauma to the eye.  He recalls pulling weeds from his lawn that day, but no contact to the eye is known.  He denies gritty sensation or pain at the eye with blinking.  He does have some slight itching, but rare.      Review of Systems ROS otherwise unremarkable unless listed above.      Objective:   Physical Exam  Constitutional: He is oriented to person, place, and time. He appears well-developed and well-nourished. No distress.  BP 122/72  Pulse 83  Temp(Src) 98.6 F (37 C) (Oral)  Resp 17  Ht 5\' 11"  (1.803 m)  Wt 170 lb (77.111 kg)  BMI 23.72 kg/m2  SpO2 98%   HENT:  Head: Atraumatic.  Eyes: Lids are everted and swept, no foreign bodies found. Right eye exhibits no discharge. Left eye exhibits discharge (Slight crusting at the upper eyelid near punctate site, however none on lower lid or cobble stoning of lower eye lid.  ) and hordeolum (Erythematous swelling to the upper eye lid.  Eversion shows a punctate site with white exudate.  ). Right conjunctiva is not injected. Right conjunctiva has no hemorrhage. Left conjunctiva is not injected. Left conjunctiva has no hemorrhage. Right pupil is round and reactive. Left pupil is round and reactive. Pupils are equal.  Full range of ocular movement without pain.     Pulmonary/Chest: Effort normal.  Neurological: He is alert and oriented to person, place, and time.        Visual Acuity Screening   Right eye Left eye Both eyes  Without correction:     With correction: 20/13 20/13 20/13            Assessment & Plan:  48 [facility] concern of [facility] pain.  The upper left eyelid is erythematous and edematous, but with no conjunctiva injection.  There is no evidence of corneal abrasion or compromise.  Ocular movement is normal with no pain or swollen orbicularis, suggesting that this is a hordeolum without foreign body or cellulitis.  Hordeolum, left - Plan: tobramycin (TOBREX) 0.3 % ophthalmic ointment BID-TID application Warm Compresses for 15 minutes at least 4x/day  -He will return if any of the aforementioned are present or if it does not clear in 2 weeks.       Trena PlattStephanie Mahina Salatino, PA-C [facility] 10/29/20152:58 PM

## 2013-12-22 NOTE — Progress Notes (Signed)
Patient discussed and examined with Ms. English. Agree with assessment and plan of care per her note.   

## 2014-01-29 ENCOUNTER — Ambulatory Visit (INDEPENDENT_AMBULATORY_CARE_PROVIDER_SITE_OTHER): Payer: BC Managed Care – PPO | Admitting: Family Medicine

## 2014-01-29 VITALS — BP 122/82 | HR 77 | Temp 97.9°F | Resp 16 | Ht 70.5 in | Wt 168.2 lb

## 2014-01-29 DIAGNOSIS — L218 Other seborrheic dermatitis: Secondary | ICD-10-CM

## 2014-01-29 DIAGNOSIS — Z8711 Personal history of peptic ulcer disease: Secondary | ICD-10-CM

## 2014-01-29 DIAGNOSIS — K219 Gastro-esophageal reflux disease without esophagitis: Secondary | ICD-10-CM | POA: Insufficient documentation

## 2014-01-29 DIAGNOSIS — L219 Seborrheic dermatitis, unspecified: Secondary | ICD-10-CM

## 2014-01-29 DIAGNOSIS — Z0000 Encounter for general adult medical examination without abnormal findings: Secondary | ICD-10-CM

## 2014-01-29 DIAGNOSIS — Z23 Encounter for immunization: Secondary | ICD-10-CM

## 2014-01-29 LAB — POCT CBC
Granulocyte percent: 44.4 %G (ref 37–80)
HCT, POC: 47.4 % (ref 43.5–53.7)
Hemoglobin: 15.5 g/dL (ref 14.1–18.1)
Lymph, poc: 1.8 (ref 0.6–3.4)
MCH, POC: 28.8 pg (ref 27–31.2)
MCHC: 32.6 g/dL (ref 31.8–35.4)
MCV: 88.1 fL (ref 80–97)
MID (cbc): 0.2 (ref 0–0.9)
MPV: 10.4 fL (ref 0–99.8)
POC Granulocyte: 1.6 — AB (ref 2–6.9)
POC LYMPH PERCENT: 49.6 %L (ref 10–50)
POC MID %: 6 %M (ref 0–12)
Platelet Count, POC: 125 10*3/uL — AB (ref 142–424)
RBC: 5.38 M/uL (ref 4.69–6.13)
RDW, POC: 11.7 %
WBC: 3.7 10*3/uL — AB (ref 4.6–10.2)

## 2014-01-29 LAB — COMPREHENSIVE METABOLIC PANEL
ALT: 17 U/L (ref 0–53)
AST: 20 U/L (ref 0–37)
Albumin: 4.9 g/dL (ref 3.5–5.2)
Alkaline Phosphatase: 42 U/L (ref 39–117)
BUN: 10 mg/dL (ref 6–23)
CO2: 28 mEq/L (ref 19–32)
Calcium: 9.6 mg/dL (ref 8.4–10.5)
Chloride: 101 mEq/L (ref 96–112)
Creat: 1 mg/dL (ref 0.50–1.35)
Glucose, Bld: 93 mg/dL (ref 70–99)
Potassium: 4 mEq/L (ref 3.5–5.3)
Sodium: 137 mEq/L (ref 135–145)
Total Bilirubin: 1.9 mg/dL — ABNORMAL HIGH (ref 0.2–1.2)
Total Protein: 7.5 g/dL (ref 6.0–8.3)

## 2014-01-29 LAB — POCT URINALYSIS DIPSTICK
Bilirubin, UA: NEGATIVE
Blood, UA: NEGATIVE
Glucose, UA: NEGATIVE
Ketones, UA: NEGATIVE
Leukocytes, UA: NEGATIVE
Nitrite, UA: NEGATIVE
Protein, UA: NEGATIVE
Spec Grav, UA: 1.015
Urobilinogen, UA: 1
pH, UA: 7

## 2014-01-29 LAB — LIPID PANEL
Cholesterol: 161 mg/dL (ref 0–200)
HDL: 48 mg/dL (ref >39)
LDL Cholesterol: 94 mg/dL (ref 0–99)
Total CHOL/HDL Ratio: 3.4 Ratio
Triglycerides: 94 mg/dL (ref <150)
VLDL: 19 mg/dL (ref 0–40)

## 2014-01-29 LAB — IFOBT (OCCULT BLOOD): IFOBT: NEGATIVE

## 2014-01-29 MED ORDER — KETOCONAZOLE 2 % EX SHAM: MEDICATED_SHAMPOO | CUTANEOUS | Status: DC

## 2014-01-29 MED ORDER — PANTOPRAZOLE SODIUM 40 MG PO TBEC: 40.0000 mg | DELAYED_RELEASE_TABLET | Freq: Every day | ORAL | Status: DC

## 2014-01-29 NOTE — Progress Notes (Addendum)
Patient ID: Gary LevinsKevin L Dean MRN: [id number], DOB: 02/17/1966 48 y.o. Date of Encounter: 01/29/2014, 9:43 AM  Primary Physician: Gary DueGUEST, Gary WARREN, MD  Chief Complaint: Physical (CPE)  HPI: 48 y.o. y/o male with history noted below here for CPE.  Doing well. No issues/complaints. 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 Eyes: No visual changes, eye redness, or discharge. ENT/Mouth: Ears: No otalgia, tinnitus, hearing loss, discharge. Nose: No congestion, rhinorrhea, sinus pain, or epistaxis. Throat: No sore throat, post nasal drip, or teeth pain. Cardiovascular: No CP, palpitations, diaphoresis, DOE, edema, orthopnea, PND. Respiratory: No cough, hemoptysis, SOB, or wheezing. Gastrointestinal: No anorexia, dysphagia, reflux, pain, nausea, vomiting, hematemesis, diarrhea, constipation, BRBPR, or melena. Genitourinary: No dysuria, frequency, urgency, hematuria, incontinence, nocturia, decreased urinary stream, discharge, impotence, or testicular pain/masses. Musculoskeletal: No decreased ROM, myalgias, stiffness, joint swelling, or weakness. Skin: No rash, erythema, lesion changes, pain, warmth, jaundice, or pruritis. Neurological: No headache, dizziness, syncope, seizures, tremors, memory loss, coordination problems, or paresthesias. Psychological: No anxiety, depression, hallucinations, SI/HI. Endocrine: No fatigue, polydipsia, polyphagia, polyuria, or known diabetes. All other systems were reviewed and are otherwise negative.  Past Medical History  Diagnosis Date  . Allergy   . Anemia   . Ulcer   . Chronic headaches     3-4 times a day     Past Surgical History  Procedure Laterality Date  . Left knee arthroscopy      Home Meds:  Prior to Admission medications   Medication Sig Start Date End Date Taking? Authorizing Provider   cetirizine (ZYRTEC) 10 MG tablet Take 10 mg by mouth daily.   Yes Historical Provider, MD  ketoconazole (NIZORAL) 2 % shampoo Apply topically 2 (two) times a week. 01/29/14  Yes Gary SidleKurt Errik Mitchelle, MD  pantoprazole (PROTONIX) 40 MG tablet Take 1 tablet (40 mg total) by mouth daily. 01/29/14 01/29/15  Gary SidleKurt Brenn Gatton, MD    Allergies: No Known Allergies  History   Social History  . Marital Status: Married    Spouse Name: N/A    Number of Children: N/A  . Years of Education: N/A   Occupational History  . Not on file.   Social History Main Topics  . Smoking status: Never Smoker   . Smokeless tobacco: Not on file  . Alcohol Use: No  . Drug Use: No  . Sexual Activity: Not on file   Other Topics Concern  . Not on file   Social History Narrative    Family History  Problem Relation Age of Onset  . Asthma Mother   . Hypertension Mother     Physical Exam: Blood pressure 122/82, pulse 77, temperature 97.9 F (36.6 C), temperature source Oral, resp. rate 16, height 5' 10.5" (1.791 m), weight 168 lb 3.2 oz (76.295 kg), SpO2 100 %.  BP Readings from Last 3 Encounters:  01/29/14 122/82  12/19/13 122/72  01/28/13 110/80   General: Well developed, well nourished, in no acute distress. HEENT: Normocephalic, atraumatic. Conjunctiva pink, sclera non-icteric. Pupils 2 mm constricting to 1 mm, round, regular, and equally reactive to light and accomodation. EOMI. Internal auditory canal clear. TMs with good cone of light and without pathology. Nasal mucosa pink. Nares are without discharge. No sinus tenderness. Oral mucosa pink. Dentition good. Pharynx without exudate.     Visual Acuity  Right Eye Distance:   Left Eye Distance:   Bilateral  Distance:    Right Eye Near:   Left Eye Near:    Bilateral Near:      Neck: Supple. Trachea midline. No thyromegaly. Full ROM. No lymphadenopathy. Lungs: Clear to auscultation bilaterally without wheezes, rales, or rhonchi. Breathing is of normal  effort and unlabored. Cardiovascular: RRR with S1 S2. No murmurs, rubs, or gallops appreciated. Distal pulses 2+ symmetrically. No carotid or abdominal bruits Abdomen: Soft, non-tender, non-distended with normoactive bowel sounds. No hepatosplenomegaly or masses. No rebound/guarding. No CVA tenderness. Without hernias.  Rectal: No external hemorrhoids or fissures. Rectal vault without masses.  Genitourinary:  uncircumcised male. No penile lesions. Testes descended bilaterally, and smooth without tenderness or masses.  Musculoskeletal: Full range of motion and 5/5 strength throughout. Without swelling, atrophy, tenderness, crepitus, or warmth. Extremities without clubbing, cyanosis, or edema. Calves supple. Skin: Warm and moist without erythema, ecchymosis, wounds, or rash. Neuro: A+Ox3. CN II-XII grossly intact. Moves all extremities spontaneously. Full sensation throughout. Normal gait. DTR 2+ throughout upper and lower extremities. Finger to nose intact. Psych:  Responds to questions appropriately with a normal affect.   Lab Results  Component Value Date   CHOL 174 01/28/2013   CHOL 163 02/10/2012   Lab Results  Component Value Date   HDL 49 01/28/2013   HDL 48 02/10/2012   Lab Results  Component Value Date   LDLCALC 109* 01/28/2013   LDLCALC 96 02/10/2012   Lab Results  Component Value Date   TRIG 82 01/28/2013   TRIG 96 02/10/2012   Lab Results  Component Value Date   CHOLHDL 3.6 01/28/2013   CHOLHDL 3.4 02/10/2012   No results found for: LDLDIRECT   Note: We were unable to obtain the purple top tube for CBC and patient became hypotensive. He will be given an order to have this drawn at an outside lab.  Assessment/Plan:  48 y.o. y/o healthy male here for CPE -Annual physical exam - Plan: IFOBT POC (occult bld, rslt in office), POCT CBC, POCT urinalysis dipstick, Comprehensive metabolic panel, Lipid panel, PSA  Need for prophylactic vaccination and inoculation against  influenza - Plan: Flu Vaccine QUAD 36+ mos IM  H/O peptic ulcer - Plan: pantoprazole (PROTONIX) 40 MG tablet  Seborrheic dermatitis of scalp - Plan: ketoconazole (NIZORAL) 2 % shampoo  Signed, Gary SidleKurt Odalis Jordan, MD 01/29/2014 9:43 AM

## 2014-01-29 NOTE — Patient Instructions (Signed)

## 2014-01-30 LAB — PSA: PSA: 3.62 ng/mL (ref <=4.00)

## 2014-12-12 ENCOUNTER — Ambulatory Visit (INDEPENDENT_AMBULATORY_CARE_PROVIDER_SITE_OTHER): Payer: BC Managed Care – PPO

## 2014-12-12 ENCOUNTER — Ambulatory Visit (INDEPENDENT_AMBULATORY_CARE_PROVIDER_SITE_OTHER): Payer: BC Managed Care – PPO | Admitting: Physician Assistant

## 2014-12-12 VITALS — BP 112/88 | HR 83 | Temp 98.5°F | Resp 16 | Ht 70.0 in | Wt 164.0 lb

## 2014-12-12 DIAGNOSIS — R05 Cough: Secondary | ICD-10-CM

## 2014-12-12 DIAGNOSIS — R062 Wheezing: Secondary | ICD-10-CM

## 2014-12-12 DIAGNOSIS — R058 Other specified cough: Secondary | ICD-10-CM

## 2014-12-12 DIAGNOSIS — J209 Acute bronchitis, unspecified: Secondary | ICD-10-CM | POA: Diagnosis not present

## 2014-12-12 MED ORDER — HYDROCOD POLST-CPM POLST ER 10-8 MG/5ML PO SUER: 5.0000 mL | Freq: Every evening | ORAL | Status: DC | PRN

## 2014-12-12 MED ORDER — BENZONATATE 100 MG PO CAPS: 100.0000 mg | ORAL_CAPSULE | Freq: Three times a day (TID) | ORAL | Status: DC | PRN

## 2014-12-12 MED ORDER — AZITHROMYCIN 250 MG PO TABS: ORAL_TABLET | ORAL | Status: DC

## 2014-12-12 NOTE — Patient Instructions (Signed)

## 2014-12-12 NOTE — Progress Notes (Signed)
[facility] [address], [place] [address] [phone]  Date:  12/12/2014   Name:  Gary LevinsKevin L Dean   DOB:  03/23/1965   MRN:  [id number]  PCP:  Tally DueGUEST, Gary WARREN, MD    History of Present Illness:  Gary Dean is a 49 y.o. male patient who [facility] chief complaint of productive cough.  Started 8 days ago as a mild non-productive cough that progressively worsened.  Some nasal congestion.  He has noticed no fever, chills, or body aches.  He has had no dyspnea, or sob.  He has little fatigue though appears secondary to lack of sleep.  No ear discomfort No ear discomfort.  He has attempted mucinex DM, sudafed, and robitussin DM.  Mucinex appeared to break the cough down.  Benadryl for sleep.    Patient Active Problem List   Diagnosis Date Noted  . GERD (gastroesophageal reflux disease) 01/29/2014  . Seborrheic dermatitis of scalp 01/29/2014    Past Medical History  Diagnosis Date  . Allergy   . Anemia   . Ulcer   . Chronic headaches     3-4 times a day    Past Surgical History  Procedure Laterality Date  . Left knee arthroscopy      Social History  Substance Use Topics  . Smoking status: Never Smoker   . Smokeless tobacco: None  . Alcohol Use: No    Family History  Problem Relation Age of Onset  . Asthma Mother   . Hypertension Mother     No Known Allergies  Medication list has been reviewed and updated.  Current Outpatient Prescriptions on File Prior to Visit  Medication Sig Dispense Refill  . cetirizine (ZYRTEC) 10 MG tablet Take 10 mg by mouth daily.    Marland Kitchen. ketoconazole (NIZORAL) 2 % shampoo Apply topically 2 (two) times a week. 120 mL 11  . pantoprazole (PROTONIX) 40 MG tablet Take 1 tablet (40 mg total) by mouth daily. (Patient not taking: Reported on 12/12/2014) 30 tablet 11   No current facility-administered medications on file prior to visit.    ROS ROS otherwise unremarkable unless listed above.   Physical  Examination: BP 112/88 mmHg  Pulse 83  Temp(Src) 98.5 F (36.9 C) (Oral)  Resp 16  Ht 5\' 10"  (1.778 m)  Wt 164 lb (74.39 kg)  BMI 23.53 kg/m2  SpO2 96% Ideal Body Weight: Weight in (lb) to have BMI = 25: 173.9  Physical Exam  Constitutional: He is oriented to person, place, and time. He appears well-developed and well-nourished. No distress.  HENT:  Head: Atraumatic.  Right Ear: Tympanic membrane, external ear and ear canal normal.  Left Ear: Tympanic membrane, external ear and ear canal normal.  Nose: Mucosal edema and rhinorrhea present. Right sinus exhibits no maxillary sinus tenderness and no frontal sinus tenderness. Left sinus exhibits no maxillary sinus tenderness and no frontal sinus tenderness.  Mouth/Throat: No uvula swelling. No oropharyngeal exudate, posterior oropharyngeal edema or posterior oropharyngeal erythema.  Eyes: Conjunctivae, EOM and lids are normal. Pupils are equal, round, and reactive to light. Right eye exhibits normal extraocular motion. Left eye exhibits normal extraocular motion.  Neck: Trachea normal and full passive range of motion without pain. No edema and no erythema present.  Cardiovascular: Normal rate.   Pulmonary/Chest: Effort normal. No respiratory distress. He has no decreased breath sounds. He has no rhonchi.  Ending expiratory sounds that seem rhonchus.  Neurological: He is alert and  oriented to person, place, and time.  Skin: Skin is warm and dry. He is not diaphoretic.  Psychiatric: He has a normal mood and affect. His behavior is normal.     [facility] reading (PRIMARY) by  Dr. Neva Seat: Peri-hilar bronchial markings.   Assessment and Plan: Gary Dean is a 49 y.o. male who is here today for chief complaint of progressively worsening productive cough.  Acute bronchitis, unspecified organism - Plan: azithromycin (ZITHROMAX) 250 MG tablet, benzonatate (TESSALON) 100 MG capsule, chlorpheniramine-HYDROcodone (TUSSIONEX PENNKINETIC ER) 10-8  MG/5ML SUER  Cough productive of clear sputum - Plan: DG Chest 2 View, chlorpheniramine-HYDROcodone (TUSSIONEX PENNKINETIC ER) 10-8 MG/5ML SUER  Expiratory wheezing - Plan: DG Chest 2 View  Gary Platt, PA-C [facility] 10/21/20167:06 PM

## 2015-02-03 ENCOUNTER — Encounter: Payer: Self-pay | Admitting: Physician Assistant

## 2015-02-03 ENCOUNTER — Ambulatory Visit (INDEPENDENT_AMBULATORY_CARE_PROVIDER_SITE_OTHER): Payer: BC Managed Care – PPO | Admitting: Physician Assistant

## 2015-02-03 VITALS — BP 118/76 | HR 72 | Temp 98.5°F | Resp 16 | Ht 70.5 in | Wt 167.0 lb

## 2015-02-03 DIAGNOSIS — Z13220 Encounter for screening for lipoid disorders: Secondary | ICD-10-CM | POA: Diagnosis not present

## 2015-02-03 DIAGNOSIS — Z862 Personal history of diseases of the blood and blood-forming organs and certain disorders involving the immune mechanism: Secondary | ICD-10-CM

## 2015-02-03 DIAGNOSIS — Z13228 Encounter for screening for other metabolic disorders: Secondary | ICD-10-CM

## 2015-02-03 DIAGNOSIS — L218 Other seborrheic dermatitis: Secondary | ICD-10-CM | POA: Diagnosis not present

## 2015-02-03 DIAGNOSIS — Z125 Encounter for screening for malignant neoplasm of prostate: Secondary | ICD-10-CM | POA: Diagnosis not present

## 2015-02-03 DIAGNOSIS — J309 Allergic rhinitis, unspecified: Secondary | ICD-10-CM | POA: Diagnosis not present

## 2015-02-03 DIAGNOSIS — Z0000 Encounter for general adult medical examination without abnormal findings: Secondary | ICD-10-CM | POA: Diagnosis not present

## 2015-02-03 DIAGNOSIS — K219 Gastro-esophageal reflux disease without esophagitis: Secondary | ICD-10-CM | POA: Diagnosis not present

## 2015-02-03 DIAGNOSIS — S161XXA Strain of muscle, fascia and tendon at neck level, initial encounter: Secondary | ICD-10-CM

## 2015-02-03 DIAGNOSIS — L219 Seborrheic dermatitis, unspecified: Secondary | ICD-10-CM

## 2015-02-03 DIAGNOSIS — Z1329 Encounter for screening for other suspected endocrine disorder: Secondary | ICD-10-CM | POA: Diagnosis not present

## 2015-02-03 LAB — CBC WITH DIFFERENTIAL/PLATELET
Basophils Absolute: 0.1 10*3/uL (ref 0.0–0.1)
Basophils Relative: 2 % — ABNORMAL HIGH (ref 0–1)
Eosinophils Absolute: 0.2 10*3/uL (ref 0.0–0.7)
Eosinophils Relative: 5 % (ref 0–5)
HCT: 45.3 % (ref 39.0–52.0)
Hemoglobin: 15.7 g/dL (ref 13.0–17.0)
Lymphocytes Relative: 54 % — ABNORMAL HIGH (ref 12–46)
Lymphs Abs: 1.6 10*3/uL (ref 0.7–4.0)
MCH: 29.7 pg (ref 26.0–34.0)
MCHC: 34.7 g/dL (ref 30.0–36.0)
MCV: 85.6 fL (ref 78.0–100.0)
MPV: 12.3 fL (ref 8.6–12.4)
Monocytes Absolute: 0.3 10*3/uL (ref 0.1–1.0)
Monocytes Relative: 9 % (ref 3–12)
Neutro Abs: 0.9 10*3/uL — ABNORMAL LOW (ref 1.7–7.7)
Neutrophils Relative %: 30 % — ABNORMAL LOW (ref 43–77)
Platelets: 181 10*3/uL (ref 150–400)
RBC: 5.29 MIL/uL (ref 4.22–5.81)
RDW: 12.1 % (ref 11.5–15.5)
WBC: 3 10*3/uL — ABNORMAL LOW (ref 4.0–10.5)

## 2015-02-03 LAB — COMPLETE METABOLIC PANEL WITH GFR
ALT: 22 U/L (ref 9–46)
AST: 23 U/L (ref 10–40)
Albumin: 4.4 g/dL (ref 3.6–5.1)
Alkaline Phosphatase: 39 U/L — ABNORMAL LOW (ref 40–115)
BUN: 11 mg/dL (ref 7–25)
CO2: 26 mmol/L (ref 20–31)
Calcium: 9.7 mg/dL (ref 8.6–10.3)
Chloride: 101 mmol/L (ref 98–110)
Creat: 1.08 mg/dL (ref 0.60–1.35)
GFR, Est African American: >89 mL/min (ref >=60)
GFR, Est Non African American: 81 mL/min (ref >=60)
Glucose, Bld: 86 mg/dL (ref 65–99)
Potassium: 4.4 mmol/L (ref 3.5–5.3)
Sodium: 138 mmol/L (ref 135–146)
Total Bilirubin: 1.6 mg/dL — ABNORMAL HIGH (ref 0.2–1.2)
Total Protein: 6.9 g/dL (ref 6.1–8.1)

## 2015-02-03 LAB — LIPID PANEL
Cholesterol: 157 mg/dL (ref 125–200)
HDL: 48 mg/dL (ref >=40)
LDL Cholesterol: 92 mg/dL (ref <130)
Total CHOL/HDL Ratio: 3.3 Ratio (ref <=5.0)
Triglycerides: 84 mg/dL (ref <150)
VLDL: 17 mg/dL (ref <30)

## 2015-02-03 LAB — TSH: TSH: 1.203 u[IU]/mL (ref 0.350–4.500)

## 2015-02-03 MED ORDER — KETOCONAZOLE 2 % EX SHAM: MEDICATED_SHAMPOO | CUTANEOUS | Status: DC

## 2015-02-03 MED ORDER — FLUTICASONE PROPIONATE 50 MCG/ACT NA SUSP: 2.0000 | Freq: Every day | NASAL | Status: AC

## 2015-02-03 NOTE — Progress Notes (Signed)
Gary Dean  MRN: [id number] DOB: [birth year]  Subjective:  Pt presents to clinic for a CPE.  He is doing well.  1- Left side neck pain - started about 5 weeks ago - felt like a crick started but it has not resolved - now it is on the right side and gone on the left side and it is less intense - he has done nothing for the pain - no pain radiation and no tingling or weakness - right handed - NKI - he just woke up like that 2- tinnitus in the left ear in the last few weeks - bothers him at night  - no hearing loss - he has had some congestion with his allergies this fall worse than normal - he has been using his zyrtec but nothing else 2- changed from protonix to zantac  in the am over the summer - added a probiotic - h/o 12 years ago - his symptoms of heartburn have not returned on the Zantac - he tends to take it during the week only because he forgets on the wknds but he still does not have problems - he tried to go longer than 2 days without it and the symptoms of burning in his chest and bowel changes occurred so he did not want to stop it  Last dental exam: every 6 months Last vision exam: wears glasses - within the year  Vaccinations      Tetanus - declined today       Patient Active Problem List   Diagnosis Date Noted  . GERD (gastroesophageal reflux disease) 01/29/2014  . Seborrheic dermatitis of scalp 01/29/2014    Current Outpatient Prescriptions on File Prior to Visit  Medication Sig Dispense Refill  . cetirizine (ZYRTEC) 10 MG tablet Take 10 mg by mouth daily.    . ranitidine (ZANTAC) 150 MG tablet Take 150 mg by mouth 2 (two) times daily.     No current facility-administered medications on file prior to visit.    No Known Allergies  Social History   Social History  . Marital Status: Married    Spouse Name: N/A  . Number of Children: N/A  . Years of Education: N/A   Occupational History  . professor    Social History Main Topics  . Smoking status:  Never Smoker   . Smokeless tobacco: None  . Alcohol Use: No  . Drug Use: No  . Sexual Activity: Yes   Other Topics Concern  . 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 Left knee arthroscopy      Family History  Problem Relation Age of Onset  . Asthma Mother   . Hypertension Mother   . Stroke Maternal Grandmother   . Mental illness Paternal Grandmother   . Stroke Paternal Grandfather     Review of Systems  Constitutional: Negative.   HENT: Positive for congestion and tinnitus (left side). Negative for ear pain and hearing loss.   Eyes: Negative.   Respiratory: Negative.   Cardiovascular: Negative.   Gastrointestinal: Negative.   Endocrine: Negative.   Genitourinary: Negative.   Musculoskeletal: Positive for neck pain.  Skin: Negative.   Allergic/Immunologic: Positive for environmental allergies.  Neurological: Negative.   Hematological: Negative.   Psychiatric/Behavioral:  Negative.    Objective:  BP 118/76 mmHg  Pulse 72  Temp(Src) 98.5 F (36.9 C)  Resp 16  Ht 5' 10.5" (1.791 m)  Wt 167 lb (75.751 kg)  BMI 23.62 kg/m2  Physical Exam  Constitutional: He is oriented to person, place, and time and well-developed, well-nourished, and in no distress.  HENT:  Head: Normocephalic and atraumatic.  Right Ear: Hearing, tympanic membrane, external ear and ear canal normal.  Left Ear: Hearing, tympanic membrane, external ear and ear canal normal.  Nose: Nose normal.  Mouth/Throat: Uvula is midline, oropharynx is clear and moist and mucous membranes are normal.  Eyes: Conjunctivae and EOM are normal. Pupils are equal, round, and reactive to light.  Neck: Trachea normal and normal range of motion. Neck supple. No thyroid mass and no thyromegaly present.  Cardiovascular: Normal  rate, regular rhythm and normal heart sounds.   No murmur heard. Pulmonary/Chest: Effort normal and breath sounds normal.  Abdominal: Soft. Bowel sounds are normal.  Genitourinary: Rectum normal, prostate normal, testes/scrotum normal and penis normal.  Musculoskeletal: Normal range of motion.       Cervical back: He exhibits spasm (trap spasms bilaterally but no TTP).  Neurological: He is alert and oriented to person, place, and time. Gait normal.  Skin: Skin is warm and dry.  Psychiatric: Mood, memory, affect and judgment normal.    Visual Acuity Screening   Right eye Left eye Both eyes  Without correction:     With correction: 20/20 20/20 20/15     Assessment and Plan :  Annual physical exam - anticipatory guidance given to patient  History of anemia - Plan: CBC with Differential/Platelet  Screening cholesterol level - Plan: Lipid panel  Screening for thyroid disorder - Plan: TSH  Screening PSA (prostate specific antigen) - D/w pt risks vs benefits from screening and he would like to proceed - Plan: PSA  Screening for metabolic disorder - Plan: COMPLETE METABOLIC PANEL WITH GFR  Allergic rhinitis, unspecified allergic rhinitis type - Plan: fluticasone (FLONASE) 50 MCG/ACT nasal spray - ? The cause of his tinnitus - he will continue to monitor  Seborrheic dermatitis of scalp - Plan: ketoconazole (NIZORAL) 2 % shampoo  Cervical strain, acute, initial encounter  Gastroesophageal reflux disease without esophagitis - pt will try and decrease his Zanatc to 75mg  daily to see if that will control his symptoms - if not he should increase back to the Zantac 150mg  qd and continue to avoid offending foods  Benny LennertSarah Alpa Salvo PA-C  [facility] 02/03/2015 10:40 AM

## 2015-02-04 LAB — PSA: PSA: 2.87 ng/mL (ref <=4.00)

## 2015-02-04 LAB — PATHOLOGIST SMEAR REVIEW

## 2015-02-17 ENCOUNTER — Encounter: Payer: Self-pay | Admitting: Physician Assistant

## 2015-03-03 ENCOUNTER — Ambulatory Visit (INDEPENDENT_AMBULATORY_CARE_PROVIDER_SITE_OTHER): Payer: BC Managed Care – PPO | Admitting: Family Medicine

## 2015-03-03 VITALS — BP 118/70 | HR 104 | Temp 98.8°F | Resp 20 | Ht 70.0 in | Wt 167.6 lb

## 2015-03-03 DIAGNOSIS — J069 Acute upper respiratory infection, unspecified: Secondary | ICD-10-CM | POA: Diagnosis not present

## 2015-03-03 NOTE — Progress Notes (Addendum)
Subjective:  By signing my name below, I, Stann Ore, attest that this documentation has been prepared under the direction and in the presence of Meredith Staggers, MD. Electronically Signed: Stann Ore, Scribe. 03/03/2015 , 1:39 PM .  Patient was seen in Room 9 .   Patient ID: Gary Dean, male    DOB: [birth year], 50 y.o.   MRN: [id number] Chief Complaint  Patient presents with  . Headache    started sunday morning   . Nasal Congestion  . Sinusitis  . Cough   HPI Gary Dean is a 50 y.o. male Pt complains of illness that started 3 days ago. He states that it started with nasal congestion and drainage. He's been blowing his nose (mostly clear) often as the day goes on. Last 2 days, he's been having headaches with sinus pressure and cough. He felt some chills yesterday. He has taken mucinex and spat out green sputum. He has also taken sudafed twice yesterday with some relief in clearing his nasal passages. He notes nasal congestion caused him to have difficulty sleeping. He denies taking tylenol or motrin. He denies fever and ear pain.   He received a flu shot this season.   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   Patient Active Problem List   Diagnosis Date Noted  . GERD (gastroesophageal reflux disease) 01/29/2014  . Seborrheic dermatitis of scalp 01/29/2014   Past Medical History  Diagnosis Date  . Allergy   . Anemia   . Ulcer   . Chronic headaches     3-4 times a day  . GERD (gastroesophageal reflux disease)     h/o bleeding gastric ulcer 2004   Past Surgical History  Procedure Laterality Date  . Left knee arthroscopy     No Known Allergies Prior to Admission medications   Medication Sig Start Date End Date Taking? Authorizing Provider  cetirizine (ZYRTEC) 10 MG tablet Take 10 mg by mouth daily.   Yes Historical Provider, MD  fluticasone (FLONASE) 50 MCG/ACT nasal spray Place 2 sprays into both nostrils daily. 02/03/15  Yes  Morrell Riddle, PA-C  ketoconazole (NIZORAL) 2 % shampoo Apply topically 2 (two) times a week. 02/03/15  Yes Morrell Riddle, PA-C  ranitidine (ZANTAC) 150 MG tablet Take 150 mg by mouth 2 (two) times daily.   Yes Historical Provider, MD   Social History   Social History  . Marital Status: Married    Spouse Name: N/A  . Number of Children: N/A  . Years of Education: N/A   Occupational History  . professor    Social History Main Topics  . Smoking status: Never Smoker   . Smokeless tobacco: Not on file  . Alcohol Use: No  . Drug Use: No  . Sexual Activity: Yes   Other Topics Concern  . 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 Negative for fever and fatigue.  HENT: Positive for congestion, rhinorrhea and sinus pressure. Negative for ear pain.   Respiratory: Positive for cough. Negative for shortness of breath and wheezing.   Cardiovascular: Negative for chest pain.  Gastrointestinal: Negative for nausea and abdominal pain.  Neurological: Positive for headaches.       Objective:   Physical  Exam  Constitutional: He is oriented to person, place, and time. He appears well-developed and well-nourished.  HENT:  Head: Normocephalic and atraumatic.  Right Ear: Tympanic membrane, external ear and ear canal normal.  Left Ear: Tympanic membrane, external ear and ear canal normal.  Nose: Right sinus exhibits no maxillary sinus tenderness and no frontal sinus tenderness. Left sinus exhibits no maxillary sinus tenderness and no frontal sinus tenderness.  Mouth/Throat: Oropharynx is clear and moist and mucous membranes are normal. No oropharyngeal exudate or posterior oropharyngeal erythema.  Edematous turbinates bilaterally Moist mucosa  Eyes: Conjunctivae are normal. Pupils are equal,  round, and reactive to light.  Neck: Neck supple.  Cardiovascular: Normal rate, regular rhythm, normal heart sounds and intact distal pulses.   No murmur heard. Pulmonary/Chest: Effort normal and breath sounds normal. He has no wheezes. He has no rhonchi. He has no rales.  Abdominal: Soft. There is no tenderness.  Lymphadenopathy:    He has no cervical adenopathy.  Neurological: He is alert and oriented to person, place, and time.  Skin: Skin is warm and dry. No rash noted.  Psychiatric: He has a normal mood and affect. His behavior is normal.  Vitals reviewed.   Filed Vitals:   03/03/15 1306  BP: 118/70  Pulse: 104  Temp: 98.8 F (37.1 C)  TempSrc: Oral  Resp: 20  Height: 5\' 10"  (1.778 m)  Weight: 167 lb 9.6 oz (76.023 kg)  SpO2: 93%      Assessment & Plan:   Gary Dean is a 50 y.o. male Acute upper respiratory infection  -likely viral infection  -symptomatic care discussed as below.   -rtc precautions, and call if sinus symptoms not improving into next week as may need abx called in at that point.   No orders of the defined types were placed in this encounter.   Patient Instructions  Unfortunately you appear to have a viral infection. Saline nasal spray atleast 4 times per day, over the counter mucinex or mucinex DM if needed for cough, drink plenty of fluids,  Sudafed or Afrin if needed for pressure/congestion (do not exceed 3 days of Afrin use).  If not improving after this weekend - call me and I can call in an antibiotic for possible sinus infection. Return to the clinic or go to the nearest emergency room if any of your symptoms worsen or new symptoms occur.  Upper Respiratory Infection, Adult Most upper respiratory infections (URIs) are a viral infection of the air passages leading to the lungs. A URI affects the nose, throat, and upper air passages. The most common type of URI is nasopharyngitis and is typically referred to as "the common cold." URIs run their  course and usually go away on their own. Most of the time, a URI does not require medical attention, but sometimes a bacterial infection in the upper airways can follow a viral infection. This is called a secondary infection. Sinus and middle ear infections are common types of secondary upper respiratory infections. Bacterial pneumonia can also complicate a URI. A URI can worsen asthma and chronic obstructive pulmonary disease (COPD). Sometimes, these complications can require emergency medical care and may be life threatening.  CAUSES Almost all URIs are caused by viruses. A virus is a type of germ and can spread from one person to another.  RISKS FACTORS You may be at risk for a URI if:   You smoke.   You have chronic heart or lung disease.  You have a  weakened defense (immune) system.   You are very young or very old.   You have nasal allergies or asthma.  You work in crowded or poorly ventilated areas.  You work in health care facilities or schools. SIGNS AND SYMPTOMS  Symptoms typically develop 2-3 days after you come in contact with a cold virus. Most viral URIs last 7-10 days. However, viral URIs from the influenza virus (flu virus) can last 14-18 days and are typically more severe. Symptoms may include:   Runny or stuffy (congested) nose.   Sneezing.   Cough.   Sore throat.   Headache.   Fatigue.   Fever.   Loss of appetite.   Pain in your forehead, behind your eyes, and over your cheekbones (sinus pain).  Muscle aches.  DIAGNOSIS  Your health care provider may diagnose a URI by:  Physical exam.  Tests to check that your symptoms are not due to another condition such as:  Strep throat.  Sinusitis.  Pneumonia.  Asthma. TREATMENT  A URI goes away on its own with time. It cannot be cured with medicines, but medicines may be prescribed or recommended to relieve symptoms. Medicines may help:  Reduce your fever.  Reduce your cough.  Relieve  nasal congestion. HOME CARE INSTRUCTIONS   Take medicines only as directed by your health care provider.   Gargle warm saltwater or take cough drops to comfort your throat as directed by your health care provider.  Use a warm mist humidifier or inhale steam from a shower to increase air moisture. This may make it easier to breathe.  Drink enough fluid to keep your urine clear or pale yellow.   Eat soups and other clear broths and maintain good nutrition.   Rest as needed.   Return to work when your temperature has returned to normal or as your health care provider advises. You may need to stay home longer to avoid infecting others. You can also use a face mask and careful hand washing to prevent spread of the virus.  Increase the usage of your inhaler if you have asthma.   Do not use any tobacco products, including cigarettes, chewing tobacco, or electronic cigarettes. If you need help quitting, ask your health care provider. PREVENTION  The best way to protect yourself from getting a cold is to practice good hygiene.   Avoid oral or hand contact with people with cold symptoms.   Wash your hands often if contact occurs.  There is no clear evidence that vitamin C, vitamin E, echinacea, or exercise reduces the chance of developing a cold. However, it is always recommended to get plenty of rest, exercise, and practice good nutrition.  SEEK MEDICAL CARE IF:   You are getting worse rather than better.   Your symptoms are not controlled by medicine.   You have chills.  You have worsening shortness of breath.  You have brown or red mucus.  You have yellow or brown nasal discharge.  You have pain in your face, especially when you bend forward.  You have a fever.  You have swollen neck glands.  You have pain while swallowing.  You have white areas in the back of your throat. SEEK IMMEDIATE MEDICAL CARE IF:   You have severe or persistent:  Headache.  Ear  pain.  Sinus pain.  Chest pain.  You have chronic lung disease and any of the following:  Wheezing.  Prolonged cough.  Coughing up blood.  A change in your usual mucus.  You have a stiff neck.  You have changes in your:  Vision.  Hearing.  Thinking.  Mood. MAKE SURE YOU:   Understand these instructions.  Will watch your condition.  Will get help right away if you are not doing well or get worse.   This information is not intended to replace advice given to you by your health care provider. Make sure you discuss any questions you have with your health care provider.   Document Released: 08/03/2000 Document Revised: 06/24/2014 Document Reviewed: 05/15/2013 Elsevier Interactive Patient Education [employer]  I personally performed the services described in this documentation, which was scribed in my presence. The recorded information has been reviewed and considered, and addended by me as needed.

## 2015-03-03 NOTE — Patient Instructions (Signed)
Unfortunately you appear to have a viral infection. Saline nasal spray atleast 4 times per day, over the counter mucinex or mucinex DM if needed for cough, drink plenty of fluids,  Sudafed or Afrin if needed for pressure/congestion (do not exceed 3 days of Afrin use).  If not improving after this weekend - call me and I can call in an antibiotic for possible sinus infection. Return to the clinic or go to the nearest emergency room if any of your symptoms worsen or new symptoms occur.  Upper Respiratory Infection, Adult Most upper respiratory infections (URIs) are a viral infection of the air passages leading to the lungs. A URI affects the nose, throat, and upper air passages. The most common type of URI is nasopharyngitis and is typically referred to as "the common cold." URIs run their course and usually go away on their own. Most of the time, a URI does not require medical attention, but sometimes a bacterial infection in the upper airways can follow a viral infection. This is called a secondary infection. Sinus and middle ear infections are common types of secondary upper respiratory infections. Bacterial pneumonia can also complicate a URI. A URI can worsen asthma and chronic obstructive pulmonary disease (COPD). Sometimes, these complications can require emergency medical care and may be life threatening.  CAUSES Almost all URIs are caused by viruses. A virus is a type of germ and can spread from one person to another.  RISKS FACTORS You may be at risk for a URI if:   You smoke.   You have chronic heart or lung disease.  You have a weakened defense (immune) system.   You are very young or very old.   You have nasal allergies or asthma.  You work in crowded or poorly ventilated areas.  You work in health care facilities or schools. SIGNS AND SYMPTOMS  Symptoms typically develop 2-3 days after you come in contact with a cold virus. Most viral URIs last 7-10 days. However, viral URIs  from the influenza virus (flu virus) can last 14-18 days and are typically more severe. Symptoms may include:   Runny or stuffy (congested) nose.   Sneezing.   Cough.   Sore throat.   Headache.   Fatigue.   Fever.   Loss of appetite.   Pain in your forehead, behind your eyes, and over your cheekbones (sinus pain).  Muscle aches.  DIAGNOSIS  Your health care provider may diagnose a URI by:  Physical exam.  Tests to check that your symptoms are not due to another condition such as:  Strep throat.  Sinusitis.  Pneumonia.  Asthma. TREATMENT  A URI goes away on its own with time. It cannot be cured with medicines, but medicines may be prescribed or recommended to relieve symptoms. Medicines may help:  Reduce your fever.  Reduce your cough.  Relieve nasal congestion. HOME CARE INSTRUCTIONS   Take medicines only as directed by your health care provider.   Gargle warm saltwater or take cough drops to comfort your throat as directed by your health care provider.  Use a warm mist humidifier or inhale steam from a shower to increase air moisture. This may make it easier to breathe.  Drink enough fluid to keep your urine clear or pale yellow.   Eat soups and other clear broths and maintain good nutrition.   Rest as needed.   Return to work when your temperature has returned to normal or as your health care provider advises. You may  need to stay home longer to avoid infecting others. You can also use a face mask and careful hand washing to prevent spread of the virus.  Increase the usage of your inhaler if you have asthma.   Do not use any tobacco products, including cigarettes, chewing tobacco, or electronic cigarettes. If you need help quitting, ask your health care provider. PREVENTION  The best way to protect yourself from getting a cold is to practice good hygiene.   Avoid oral or hand contact with people with cold symptoms.   Wash your hands  often if contact occurs.  There is no clear evidence that vitamin C, vitamin E, echinacea, or exercise reduces the chance of developing a cold. However, it is always recommended to get plenty of rest, exercise, and practice good nutrition.  SEEK MEDICAL CARE IF:   You are getting worse rather than better.   Your symptoms are not controlled by medicine.   You have chills.  You have worsening shortness of breath.  You have brown or red mucus.  You have yellow or brown nasal discharge.  You have pain in your face, especially when you bend forward.  You have a fever.  You have swollen neck glands.  You have pain while swallowing.  You have white areas in the back of your throat. SEEK IMMEDIATE MEDICAL CARE IF:   You have severe or persistent:  Headache.  Ear pain.  Sinus pain.  Chest pain.  You have chronic lung disease and any of the following:  Wheezing.  Prolonged cough.  Coughing up blood.  A change in your usual mucus.  You have a stiff neck.  You have changes in your:  Vision.  Hearing.  Thinking.  Mood. MAKE SURE YOU:   Understand these instructions.  Will watch your condition.  Will get help right away if you are not doing well or get worse.   This information is not intended to replace advice given to you by your health care provider. Make sure you discuss any questions you have with your health care provider.   Document Released: 08/03/2000 Document Revised: 06/24/2014 Document Reviewed: 05/15/2013 Elsevier Interactive Patient Education [employer]

## 2016-02-18 ENCOUNTER — Ambulatory Visit (INDEPENDENT_AMBULATORY_CARE_PROVIDER_SITE_OTHER): Payer: BC Managed Care – PPO | Admitting: Physician Assistant

## 2016-02-18 ENCOUNTER — Encounter: Payer: Self-pay | Admitting: Physician Assistant

## 2016-02-18 VITALS — BP 126/82 | HR 79 | Temp 97.8°F | Resp 18 | Ht 70.0 in | Wt 161.0 lb

## 2016-02-18 DIAGNOSIS — Z13228 Encounter for screening for other metabolic disorders: Secondary | ICD-10-CM

## 2016-02-18 DIAGNOSIS — Z1389 Encounter for screening for other disorder: Secondary | ICD-10-CM

## 2016-02-18 DIAGNOSIS — Z13220 Encounter for screening for lipoid disorders: Secondary | ICD-10-CM | POA: Diagnosis not present

## 2016-02-18 DIAGNOSIS — Z125 Encounter for screening for malignant neoplasm of prostate: Secondary | ICD-10-CM

## 2016-02-18 DIAGNOSIS — L219 Seborrheic dermatitis, unspecified: Secondary | ICD-10-CM | POA: Diagnosis not present

## 2016-02-18 DIAGNOSIS — Z130 Encounter for screening for diseases of the blood and blood-forming organs and certain disorders involving the immune mechanism: Secondary | ICD-10-CM | POA: Diagnosis not present

## 2016-02-18 DIAGNOSIS — Z1329 Encounter for screening for other suspected endocrine disorder: Secondary | ICD-10-CM

## 2016-02-18 DIAGNOSIS — Z0000 Encounter for general adult medical examination without abnormal findings: Secondary | ICD-10-CM | POA: Diagnosis not present

## 2016-02-18 LAB — POCT URINALYSIS DIP (MANUAL ENTRY)
Blood, UA: NEGATIVE
Glucose, UA: NEGATIVE
LEUKOCYTES UA: NEGATIVE
NITRITE UA: NEGATIVE
PH UA: 5.5
PROTEIN UA: NEGATIVE
Spec Grav, UA: 1.025
Urobilinogen, UA: 0.2

## 2016-02-18 MED ORDER — AMCINONIDE 0.1 % EX LOTN: TOPICAL_LOTION | Freq: Two times a day (BID) | CUTANEOUS | 0 refills | Status: DC

## 2016-02-18 NOTE — Progress Notes (Signed)
[facility] [address], [place] [address] [phone]  Date:  02/18/2016   Name:  Gary Dean   DOB:  03-21-1965   MRN:  [id number]  PCP:  Kennon Portela, MD    History of Present Illness:  Gary Dean is a 50 y.o. male patient who presents to [facility]    Diet: fair, typical morning of cereal or cream of wheat.  Wife cooks daily.  Leftovers for lunch.  Eats desserts daily.  He gets vegetables daily at night.  Water intake 25-30oz per day.  Sodas rare once per week.  Green tea few times per week.  BM: tid.  No diarrhea or constipation.  No blood or black in stool.  Hx of anemia secondary to peptic ulcer.   Urination: normal.  No change in frequency, dysuria, hematuria, or weakened stream.  Sleep: not really.  Difficulty staying asleep.  4:30am will awaken.  Read a book for about 45 minutes, or may stay asleep.  He may take a benadryl to ensure his sleep.  Job stressors.  Interim dean of accounting.  Tries to develop pattern of stopping work at [employer]: recent promotion, and cold and dark, may work out for 1 day, which decreased his 3 times per week.   Social activity:  EtOH: none Illicit drug use: none Tobacco or vaping: none. Sexual active: no pain or issues.   Patient Active Problem List   Diagnosis Date Noted  . GERD (gastroesophageal reflux disease) 01/29/2014  . Seborrheic dermatitis of scalp 01/29/2014    Past Medical History:  Diagnosis Date  . Allergy   . Anemia   . Chronic headaches    3-4 times a day  . GERD (gastroesophageal reflux disease)    h/o bleeding gastric ulcer 2004  . Ulcer [facility])     Past Surgical History:  Procedure Laterality Date  . left knee arthroscopy      Social History  Substance Use Topics  . Smoking status: Never Smoker  . Smokeless tobacco: Never Used  . Alcohol use No    Family History  Problem Relation Age of Onset  . Asthma Mother   . Hypertension Mother   . Stroke Maternal  Grandmother   . Mental illness Paternal Grandmother   . Stroke Paternal Grandfather     No Known Allergies  Medication list has been reviewed and updated.  Current Outpatient Prescriptions on File Prior to Visit  Medication Sig Dispense Refill  . cetirizine (ZYRTEC) 10 MG tablet Take 10 mg by mouth daily.    . ranitidine (ZANTAC) 150 MG tablet Take 150 mg by mouth 2 (two) times daily.    . fluticasone (FLONASE) 50 MCG/ACT nasal spray Place 2 sprays into both nostrils daily. (Patient not taking: Reported on 02/18/2016) 16 g 6  . ketoconazole (NIZORAL) 2 % shampoo Apply topically 2 (two) times a week. (Patient not taking: Reported on 02/18/2016) 120 mL 11   No current facility-administered medications on file prior to visit.     Review of Systems  Constitutional: Negative for chills and fever.  HENT: Negative for ear discharge, ear pain and sore throat.   Eyes: Negative for blurred vision and double vision.  Respiratory: Negative for cough, shortness of breath and wheezing.   Cardiovascular: Negative for chest pain, palpitations and leg swelling.  Gastrointestinal: Negative for diarrhea, nausea and vomiting.  Genitourinary: Negative for dysuria, frequency and hematuria.  Skin: Positive for rash (scalp). Negative for  itching.  Neurological: Negative for dizziness and headaches.  Psychiatric/Behavioral: The patient has insomnia.      Physical Examination: BP 126/82 (BP Location: Right Arm, Patient Position: Sitting, Cuff Size: Small)   Pulse 79   Temp 97.8 F (36.6 C) (Oral)   Resp 18   Ht _0  (1.778 m)   Wt 161 lb (73 kg)   SpO2 99%   BMI 23.10 kg/m  Ideal Body Weight: Weight in (lb) to have BMI = 25: 173.9  Physical Exam  Constitutional: He is oriented to person, place, and time. He appears well-developed and well-nourished. No distress.  HENT:  Head: Normocephalic and atraumatic.  Right Ear: Tympanic membrane, external ear and ear canal normal.  Left Ear: Tympanic  membrane, external ear and ear canal normal.  Eyes: Conjunctivae and EOM are normal. Pupils are equal, round, and reactive to light.  Cardiovascular: Normal rate and regular rhythm.  Exam reveals no friction rub.   No murmur heard. Pulmonary/Chest: Effort normal. No respiratory distress. He has no wheezes.  Abdominal: Soft. Bowel sounds are normal. He exhibits no distension and no mass. There is no tenderness.  Genitourinary: Prostate normal. Prostate is not enlarged and not tender.  Musculoskeletal: Normal range of motion. He exhibits no edema or tenderness.  Neurological: He is alert and oriented to person, place, and time. He displays normal reflexes.  Skin: Skin is warm and dry. He is not diaphoretic.  Base of head with scaling and flakes.  No broken skin observed, or skin thickening.  Psychiatric: He has a normal mood and affect. His behavior is normal.      Assessment and Plan: Gary Dean is a 50 y.o. male who is here today for annual physical There are no diagnoses linked to this encounter. --screening labs obtained --flu vaccine was updated at work --rechecking psa --steroid lotion added to seborrheic dermatitis regimen Annual physical exam - Plan: CBC, CMP14+EGFR, TSH, PSA, Lipid panel, POCT urinalysis dipstick  Screening for deficiency anemia - Plan: CBC  Screening for metabolic disorder - Plan: CMP14+EGFR  Screening for thyroid disorder - Plan: TSH  Screening for prostate cancer - Plan: PSA  Screening for lipid disorders - Plan: Lipid panel  Screening for hematuria or proteinuria - Plan: POCT urinalysis dipstick  Seborrheic dermatitis of scalp - Plan: amcinonide (CYCLOCORT) 0.1 % lotion  Ivar Drape, PA-C [facility] 12/28/201710:16 AM

## 2016-02-18 NOTE — Patient Instructions (Addendum)
Please do not use the steroid lotion longer than two weeks at a time, while allowing 3-4 weeks in between to rest it.  Please let me know how this is working.  This is twice per day, not one. I will have your lab results within 2 weeks. Insomnia Insomnia is a sleep disorder that makes it difficult to fall asleep or to stay asleep. Insomnia can cause tiredness (fatigue), low energy, difficulty concentrating, mood swings, and poor performance at work or school. There are three different ways to classify insomnia:  Difficulty falling asleep.  Difficulty staying asleep.  Waking up too early in the morning. Any type of insomnia can be long-term (chronic) or short-term (acute). Both are common. Short-term insomnia usually lasts for three months or less. Chronic insomnia occurs at least three times a week for longer than three months. What are the causes? Insomnia may be caused by another condition, situation, or substance, such as:  Anxiety.  Certain medicines.  Gastroesophageal reflux disease (GERD) or other gastrointestinal conditions.  Asthma or other breathing conditions.  Restless legs syndrome, sleep apnea, or other sleep disorders.  Chronic pain.  Menopause. This may include hot flashes.  Stroke.  Abuse of alcohol, tobacco, or illegal drugs.  Depression.  Caffeine.  Neurological disorders, such as Alzheimer disease.  An overactive thyroid (hyperthyroidism). The cause of insomnia may not be known. What increases the risk? Risk factors for insomnia include:  Gender. Women are more commonly affected than men.  Age. Insomnia is more common as you get older.  Stress. This may involve your professional or personal life.  Income. Insomnia is more common in people with lower income.  Lack of exercise.  Irregular work schedule or night shifts.  Traveling between different time zones. What are the signs or symptoms? If you have insomnia, trouble falling asleep or  trouble staying asleep is the main symptom. This may lead to other symptoms, such as:  Feeling fatigued.  Feeling nervous about going to sleep.  Not feeling rested in the morning.  Having trouble concentrating.  Feeling irritable, anxious, or depressed. How is this treated? Treatment for insomnia depends on the cause. If your insomnia is caused by an underlying condition, treatment will focus on addressing the condition. Treatment may also include:  Medicines to help you sleep.  Counseling or therapy.  Lifestyle adjustments. Follow these instructions at home:  Take medicines only as directed by your health care provider.  Keep regular sleeping and waking hours. Avoid naps.  Keep a sleep diary to help you and your health care provider figure out what could be causing your insomnia. Include:  When you sleep.  When you wake up during the night.  How well you sleep.  How rested you feel the next day.  Any side effects of medicines you are taking.  What you eat and drink.  Make your bedroom a comfortable place where it is easy to fall asleep:  Put up shades or special blackout curtains to block light from outside.  Use a white noise machine to block noise.  Keep the temperature cool.  Exercise regularly as directed by your health care provider. Avoid exercising right before bedtime.  Use relaxation techniques to manage stress. Ask your health care provider to suggest some techniques that may work well for you. These may include:  Breathing exercises.  Routines to release muscle tension.  Visualizing peaceful scenes.  Cut back on alcohol, caffeinated beverages, and cigarettes, especially close to bedtime. These can disrupt your  sleep.  Do not overeat or eat spicy foods right before bedtime. This can lead to digestive discomfort that can make it hard for you to sleep.  Limit screen use before bedtime. This includes:  Watching TV.  Using your smartphone,  tablet, and computer.  Stick to a routine. This can help you fall asleep faster. Try to do a quiet activity, brush your teeth, and go to bed at the same time each night.  Get out of bed if you are still awake after 15 minutes of trying to sleep. Keep the lights down, but try reading or doing a quiet activity. When you feel sleepy, go back to bed.  Make sure that you drive carefully. Avoid driving if you feel very sleepy.  Keep all follow-up appointments as directed by your health care provider. This is important. Contact a health care provider if:  You are tired throughout the day or have trouble in your daily routine due to sleepiness.  You continue to have sleep problems or your sleep problems get worse. Get help right away if:  You have serious thoughts about hurting yourself or someone else. This information is not intended to replace advice given to you by your health care provider. Make sure you discuss any questions you have with your health care provider. Document Released: 02/05/2000 Document Revised: 07/10/2015 Document Reviewed: 11/08/2013 Elsevier Interactive Patient Education  2017 [employer]    IF you received an x-ray today, you will receive an invoice from [facility]. Please contact [facility] at [phone] with questions or concerns regarding your invoice.   IF you received labwork today, you will receive an invoice from [facility]. Please contact [facility] at [phone] with questions or concerns regarding your invoice.   Our billing staff will not be able to assist you with questions regarding bills from these companies.  You will be contacted with the lab results as soon as they are available. The fastest way to get your results is to activate your My Chart account. Instructions are located on the last page of this paperwork. If you have not heard from us regarding the results in 2 weeks, please contact this office.

## 2016-02-19 LAB — LIPID PANEL
CHOL/HDL RATIO: 3.6 ratio (ref 0.0–5.0)
CHOLESTEROL TOTAL: 167 mg/dL (ref 100–199)
HDL: 46 mg/dL (ref >39)
LDL CALC: 104 mg/dL — AB (ref 0–99)
Triglycerides: 86 mg/dL (ref 0–149)
VLDL CHOLESTEROL CAL: 17 mg/dL (ref 5–40)

## 2016-02-19 LAB — CBC
HEMATOCRIT: 47.3 % (ref 37.5–51.0)
Hemoglobin: 15.4 g/dL (ref 13.0–17.7)
MCH: 28.8 pg (ref 26.6–33.0)
MCHC: 32.6 g/dL (ref 31.5–35.7)
MCV: 89 fL (ref 79–97)
PLATELETS: 184 10*3/uL (ref 150–379)
RBC: 5.34 x10E6/uL (ref 4.14–5.80)
RDW: 12.3 % (ref 12.3–15.4)
WBC: 3.1 10*3/uL — ABNORMAL LOW (ref 3.4–10.8)

## 2016-02-19 LAB — CMP14+EGFR
A/G RATIO: 2.2 (ref 1.2–2.2)
ALT: 24 IU/L (ref 0–44)
AST: 26 IU/L (ref 0–40)
Albumin: 4.8 g/dL (ref 3.5–5.5)
Alkaline Phosphatase: 45 IU/L (ref 39–117)
BILIRUBIN TOTAL: 1.5 mg/dL — AB (ref 0.0–1.2)
BUN/Creatinine Ratio: 10 (ref 9–20)
BUN: 11 mg/dL (ref 6–24)
CHLORIDE: 101 mmol/L (ref 96–106)
CO2: 27 mmol/L (ref 18–29)
Calcium: 9.8 mg/dL (ref 8.7–10.2)
Creatinine, Ser: 1.08 mg/dL (ref 0.76–1.27)
GFR calc non Af Amer: 81 mL/min/{1.73_m2} (ref >59)
GFR, EST AFRICAN AMERICAN: 93 mL/min/{1.73_m2} (ref >59)
GLOBULIN, TOTAL: 2.2 g/dL (ref 1.5–4.5)
Glucose: 93 mg/dL (ref 65–99)
POTASSIUM: 4.7 mmol/L (ref 3.5–5.2)
SODIUM: 141 mmol/L (ref 134–144)
TOTAL PROTEIN: 7 g/dL (ref 6.0–8.5)

## 2016-02-19 LAB — TSH: TSH: 1.23 u[IU]/mL (ref 0.450–4.500)

## 2016-02-19 LAB — PSA: Prostate Specific Ag, Serum: 3.8 ng/mL (ref 0.0–4.0)

## 2016-02-23 ENCOUNTER — Encounter: Payer: BC Managed Care – PPO | Admitting: Physician Assistant

## 2016-03-21 ENCOUNTER — Encounter: Payer: Self-pay | Admitting: Physician Assistant

## 2016-12-23 ENCOUNTER — Encounter: Payer: Self-pay | Admitting: Physician Assistant

## 2016-12-23 ENCOUNTER — Ambulatory Visit (INDEPENDENT_AMBULATORY_CARE_PROVIDER_SITE_OTHER): Payer: BC Managed Care – PPO | Admitting: Physician Assistant

## 2016-12-23 VITALS — BP 108/70 | HR 82 | Temp 98.5°F | Resp 16 | Ht 69.0 in | Wt 156.6 lb

## 2016-12-23 DIAGNOSIS — M546 Pain in thoracic spine: Secondary | ICD-10-CM

## 2016-12-23 DIAGNOSIS — G4700 Insomnia, unspecified: Secondary | ICD-10-CM | POA: Diagnosis not present

## 2016-12-23 DIAGNOSIS — M62830 Muscle spasm of back: Secondary | ICD-10-CM | POA: Diagnosis not present

## 2016-12-23 MED ORDER — CYCLOBENZAPRINE HCL 10 MG PO TABS: 10.0000 mg | ORAL_TABLET | Freq: Three times a day (TID) | ORAL | 0 refills | Status: DC | PRN

## 2016-12-23 MED ORDER — HYDROXYZINE HCL 50 MG PO TABS: 50.0000 mg | ORAL_TABLET | Freq: Every day | ORAL | 0 refills | Status: DC

## 2016-12-23 NOTE — Progress Notes (Signed)
Gary Dean  MRN: [id number] DOB: [birth year]  PCP: Trena Platt D, PA  Subjective:  Pt is a 51 year old male who presents to clinic for back pain x 24 hours. Pain is located in his mid back.  "at one point this morning I count move my  upper body".  Endorses stiffness. He had a very difficult time lifting his ironing board off the back of his door this morning. +Pain with deep breath.  She got in hot shower this morning and stretched his back out - this made it feel better. He was able to move much better after this.  Lifting makes it hurt worse. He took two aleve - helped a lot. No pain right now.   Of note, he admits to back pain similar to this when he is stressed. 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 This week several executives came for a visit from around the country. He endorses a lot of increased stressors this past week. + not sleeping well.   Not sleeping well - at times of stress, he wakes around 3 am and cannot get back to sleep. He takes benadryl - this helps some. He is interested in something for sleep.   Review of Systems  Constitutional: Negative for chills, diaphoresis, fatigue and fever.  Respiratory: Negative for cough, chest tightness, shortness of breath and wheezing.   Cardiovascular: Negative for chest pain and palpitations.  Gastrointestinal: Negative for abdominal pain, nausea and vomiting.  Genitourinary: Negative for difficulty urinating, dysuria, flank pain, frequency, hematuria and urgency.  Musculoskeletal: Positive for back pain. Negative for neck pain and neck stiffness.  Skin: Negative.   Psychiatric/Behavioral: Positive for sleep disturbance.    Patient Active Problem List   Diagnosis Date Noted  . GERD (gastroesophageal reflux disease) 01/29/2014  . Seborrheic dermatitis of scalp 01/29/2014    Current Outpatient Prescriptions on File Prior to Visit  Medication Sig Dispense Refill  .  amcinonide (CYCLOCORT) 0.1 % lotion Apply topically 2 (two) times daily. 60 mL 0  . cetirizine (ZYRTEC) 10 MG tablet Take 10 mg by mouth daily.    Marland Kitchen ketoconazole (NIZORAL) 2 % shampoo Apply topically 2 (two) times a week. 120 mL 11  . ranitidine (ZANTAC) 150 MG tablet Take 150 mg by mouth 2 (two) times daily.    . fluticasone (FLONASE) 50 MCG/ACT nasal spray Place 2 sprays into both nostrils daily. (Patient not taking: Reported on 12/23/2016) 16 g 6   No current facility-administered medications on file prior to visit.     No Known Allergies   Objective:  BP 108/70   Pulse 82   Temp 98.5 F (36.9 C) (Oral)   Resp 16   Ht 5\' 9"  (1.753 m)   Wt 156 lb 9.6 oz (71 kg)   SpO2 99%   BMI 23.13 kg/m   Physical Exam  Constitutional: He is oriented to person, place, and time and well-developed, well-nourished, and in no distress. No distress.  Neck: Normal range of motion. Neck supple.  Cardiovascular: Normal rate, regular rhythm and normal heart sounds.   Pulmonary/Chest: Effort normal and breath sounds normal. No respiratory distress. He has no wheezes. He exhibits no tenderness.  Abdominal: Soft. He exhibits no mass. There is no tenderness. There is no guarding.  Musculoskeletal:       Thoracic back: He exhibits spasm (mid left back. Mild TTP). He exhibits normal range of motion, no tenderness and  no bony tenderness.  Neurological: He is alert and oriented to person, place, and time. GCS score is 15.  Skin: Skin is warm and dry.  Psychiatric: Mood, memory, affect and judgment normal.  Vitals reviewed.   Assessment and Plan :  1. Bilateral thoracic back pain, unspecified chronicity 2. Muscle spasm of back - cyclobenzaprine (FLEXERIL) 10 MG tablet; Take 1 tablet (10 mg total) by mouth 3 (three) times daily as needed for muscle spasms.  Dispense: 30 tablet; Refill: 0 - HPI and h/o stress-related back pain strongly suggestive of muscle spasm. He is not feeling pain at this time as he  recently took Aleve and felt immediate relief. Advised hydration, stretching, foam roller massage, and heat. RTC if symptoms change or worsen, consider labs and/or imaging. He understands and agrees.  3. Insomnia, unspecified type - hydrOXYzine (ATARAX/VISTARIL) 50 MG tablet; Take 1-2 tablets (50-100 mg total) by mouth at bedtime.  Dispense: 60 tablet; Refill: 0 - Worse with increased stress. Discussed proper sleep hygiene, suggest he also try OTC melatonin. RTC if no improvement.   Marco CollieWhitney Landers Prajapati, PA-C  [facility] 12/23/2016 8:30 AM

## 2016-12-23 NOTE — Patient Instructions (Addendum)
For back pain: Foam roller Heat (moist heat like damp towel in microwave is best for muscle) Stay well hydrated - good for muscles.  Stretch Get massages Flexeril 3 times daily or at night before bed  Insomnia: Sound machine Essential oils Exercise Mediatation chelated magnesium  Melatonin No screen time before bed  Thank you for coming in today. I hope you feel we met your needs.  Feel free to call PCP if you have any questions or further requests.  Please consider signing up for MyChart if you do not already have it, as this is a great way to communicate with me.  Best,  Whitney McVey, PA-C   IF you received an x-ray today, you will receive an invoice from [facility]. Please contact [facility] at [phone] with questions or concerns regarding your invoice.   IF you received labwork today, you will receive an invoice from [facility]. Please contact [facility] at [phone] with questions or concerns regarding your invoice.   Our billing staff will not be able to assist you with questions regarding bills from these companies.  You will be contacted with the lab results as soon as they are available. The fastest way to get your results is to activate your My Chart account. Instructions are located on the last page of this paperwork. If you have not heard from [place] regarding the results in 2 weeks, please contact this office.

## 2017-02-02 IMAGING — CR DG CHEST 2V
2 series · 2 of 2 positions shown · non-contrast
Comparison: None.

CLINICAL DATA: Shortness of breath.

EXAM:
CHEST  2 VIEW

[PA]
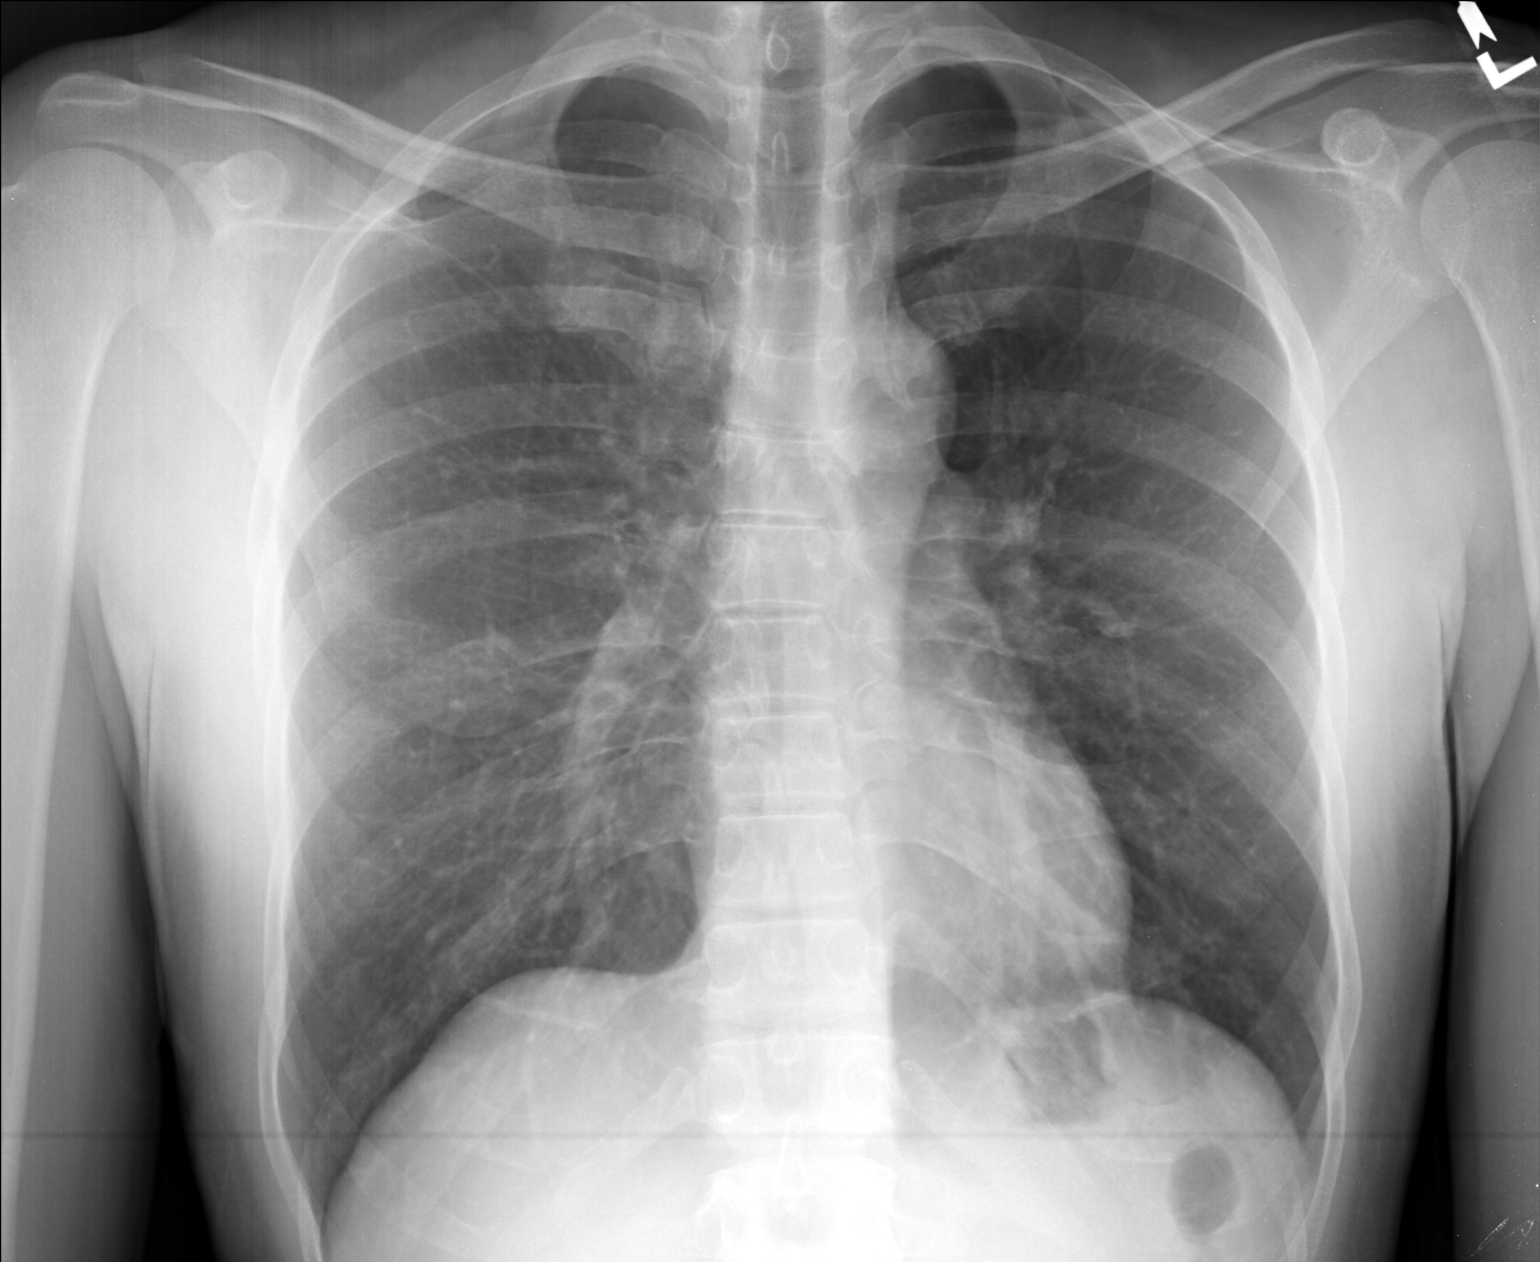

[lateral]
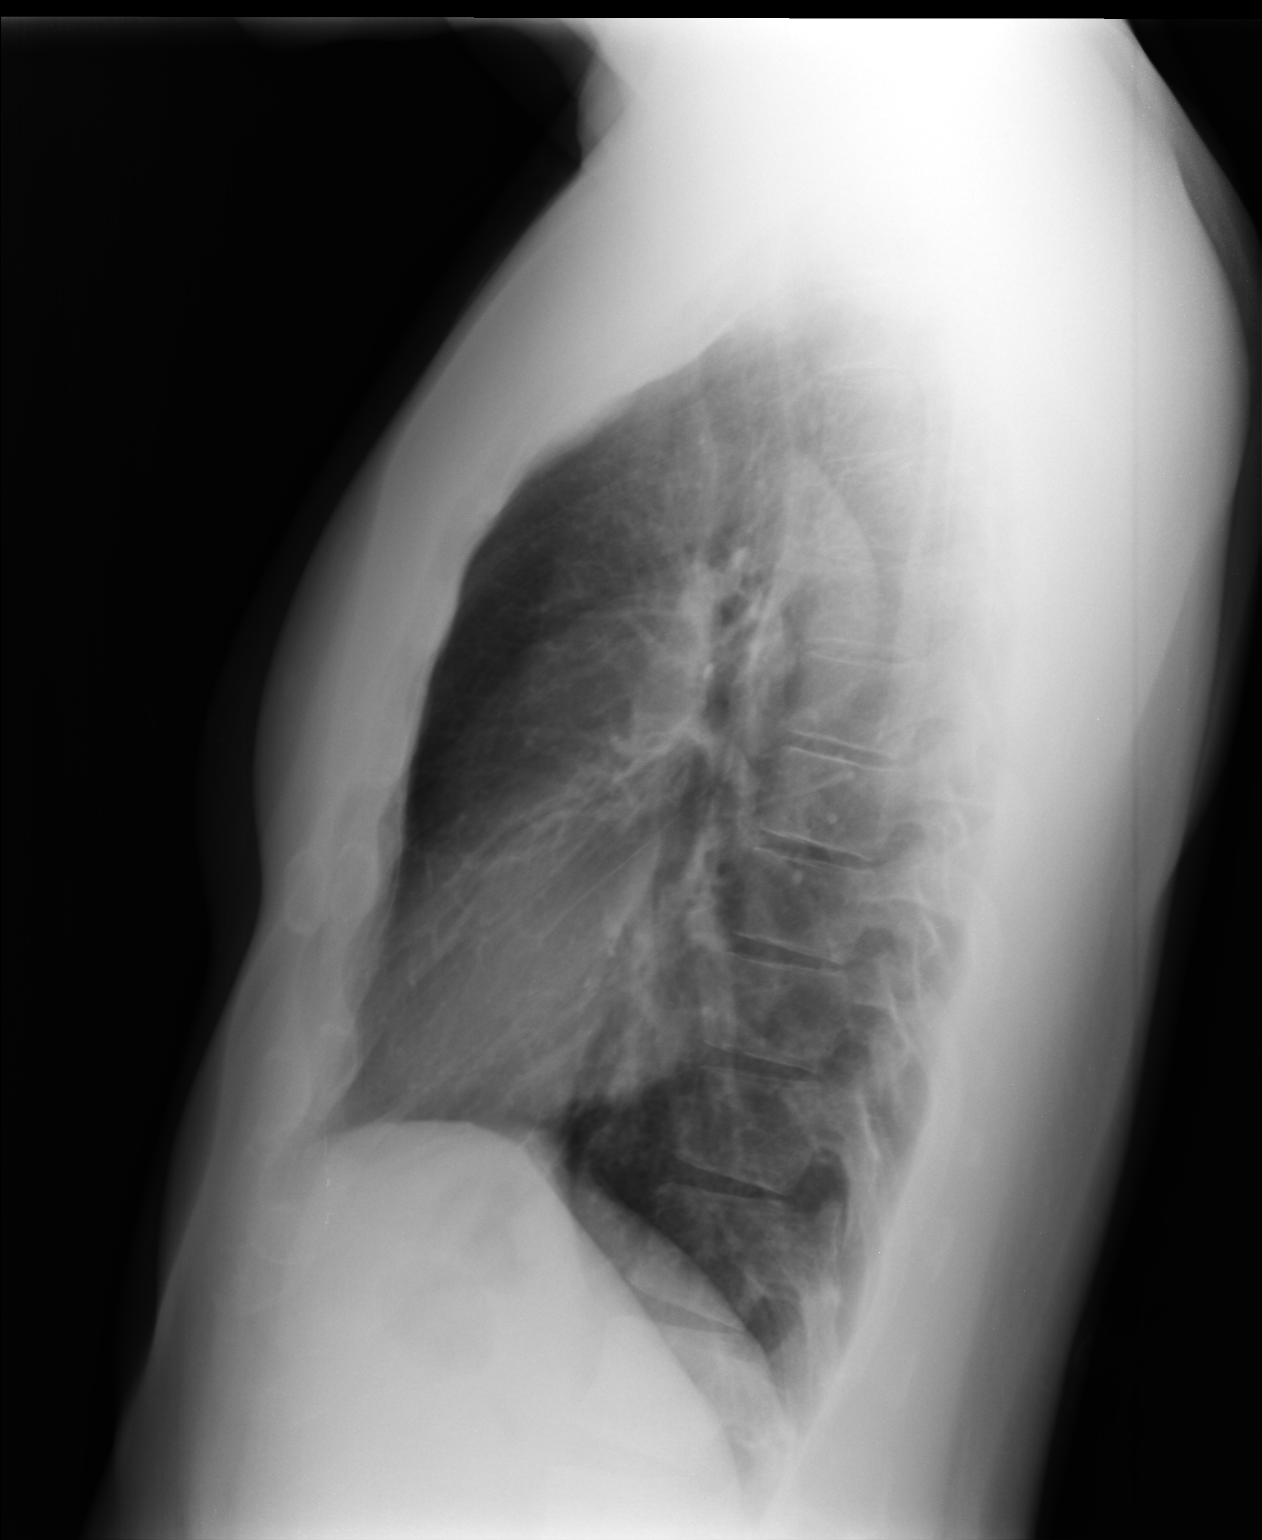

[2 of 2 positions shown; findings below may reference images not displayed]

FINDINGS: Lungs are adequately inflated without consolidation or effusion.
Cardiomediastinal silhouette is within normal. Remaining bones and
soft tissues are normal.
IMPRESSION: No active cardiopulmonary disease.

## 2017-05-31 ENCOUNTER — Encounter: Payer: Self-pay | Admitting: Physician Assistant

## 2017-08-07 ENCOUNTER — Ambulatory Visit: Payer: BC Managed Care – PPO | Admitting: Physician Assistant

## 2017-08-07 ENCOUNTER — Encounter: Payer: Self-pay | Admitting: Physician Assistant

## 2017-08-07 ENCOUNTER — Other Ambulatory Visit: Payer: Self-pay

## 2017-08-07 VITALS — BP 122/76 | HR 103 | Temp 98.7°F | Resp 18 | Ht 70.47 in | Wt 155.8 lb

## 2017-08-07 DIAGNOSIS — Z7185 Encounter for immunization safety counseling: Secondary | ICD-10-CM

## 2017-08-07 DIAGNOSIS — Z23 Encounter for immunization: Secondary | ICD-10-CM | POA: Diagnosis not present

## 2017-08-07 DIAGNOSIS — Z7689 Persons encountering health services in other specified circumstances: Secondary | ICD-10-CM

## 2017-08-07 DIAGNOSIS — Z7189 Other specified counseling: Secondary | ICD-10-CM

## 2017-08-07 NOTE — Patient Instructions (Addendum)
Today you were given a Tdap and hepatitis A vaccine. You will need a Td booster in 10 years. You will need the 2nd hepatitis A vaccine in 6 months. I recommend trying to get the typhoid injection before you leave as the oral tablets need to be refrigerated and take up to 8 days to complete. Contact the passport health travel clinic and see if you can be seen this week. You can also try the health department. Have a great trip!   Food Poisoning and Traveling Food poisoning is an illness caused by organisms present in something you ate or drank. Some types of food poisoning trigger symptoms quickly. Others may take 1-2 weeks for symptoms to appear. Symptoms of food poisoning include:  Diarrhea.  Cramping.  Fever.  Vomiting.  Dizziness.  Aches and pains.  Before you travel, learn as much as you can about the foodborne illnesses that are common in the areas where you are going. The risk for food poisoning varies from country to country and from one region of the world to another. Countries with a low risk include:  The [place].  [place].  [place].  [place].  [place].  Some countries in [place].  Countries with a mid-range risk include:  Countries in [place].  [place].  Some [place] islands.  Countries with a high risk include:  Countries in [place].  Countries in the [place].  Countries in [place].  [place].  Countries in [place] and [place].  What types of illness can be passed through food and drinks? Most cases of food poisoning are caused by bacteria or viruses, such as:  E. coli.  Campylobacteriosis.  Shigellosis.  Salmonellosis.  Norovirus.  Rotavirus.  Astrovirus.  Food poisoning can also be caused by some microscopic parasites. These are organisms that live off of another larger organism. Illness caused by parasites can take 1-2 weeks to appear and may last several months. The illnesses  include:  Giardiasis.  Amebiasis.  Cyclosporiasis.  Cryptosporidiosis.  Medicines are available to treat these infections. How can I decrease my risk of food poisoning while traveling? Good hand hygiene always helps protect your health. Carry small bottles of alcohol-based hand sanitizer. Use it to clean your hands before you eat. Also follow these basic guidelines for eating and drinking while traveling. Foods that are generally safe to eat:  Food that is thoroughly cooked.  Food that is served hot.  Hard-boiled eggs.  Fruits and vegetables you wash and peel yourself.  Milk or cheese that is treated with high heat (pasteurized).  Foods to avoid:  Raw or undercooked foods.  Raw or runny eggs.  Food that is not hot (such as food that has been on a buffet or picnic table for a while).  Raw fruits or vegetables that have not been washed and peeled.  Other items made with fresh vegetables or fruits, like salad and salsa.  Milk or cheese that is not pasteurized.  Meat from local animals, such as monkeys and bats.  Drinks that are generally safe:  Bottled waters, soda, or sports drinks.  Drinks you know were sealed until you opened them.  Water you know has been treated, boiled, or filtered to remove microorganisms.  Ice from treated or bottled water.  Drinks made with boiling water, such as tea or coffee.  Pasteurized milk.  Drinks to avoid:  Water from the tap or a well.  Water from a fresh water source, such as a stream.  Ice  from a tap, well, or fresh water source.  Beverages that include water from a well, tap, or fresh water source.  Milk that is not pasteurized.  Beverages from soda fountains.  What should I do if I think I have developed food poisoning while traveling?  If you have been vomiting or have diarrhea, drink water or other fluids to replace what you lost.  Take over-the-counter medicine to stop diarrhea. Consider packing a supply  of antidiarrheal medicine to take with you.  Contact your health care provider if your symptoms do not clear up after a few days. How is food poisoning treated? Most cases of food poisoning go away without treatment within 48 hours. Food poisoning caused by bacteria may be treated with antibiotic medicines.Viruses cannot be treated with antibiotics.Illnesses caused by parasites might respond to antiparasitic medicines. You can also treat symptoms of food poisoning with medicines to:  Prevent or slow diarrhea (antidiarrheals).  Rehydrate your body. Oral rehydration therapy (ORT) helps your body replace fluids and electrolytes lost in diarrhea or vomit.  Treat rashes caused by diarrhea using hydrocortisone cream.  Should I be proactively treated for food poisoning before I travel? Talk to your health care provider about your personal risk for contracting food poisoning while traveling. Discuss the foodborne illnesses that are common in the areas you plan to visit. Make sure you understand how to use any medicines your health care provider recommends. Some medicines can help prevent food poisoning. These include:  Bismuth subsalicylate (BSS). This is an ingredient in over-the-counter antidiarrheal medicines. It might help some adult travelers prevent illness.  Probiotics or "good" bacteria. Taking probiotics might help prevent some illnesses.  Antibiotics. These protect against bacteria only. Taking antibiotics to prevent food poisoning is usually suggested only for people who have a compromised immune system.  How can I learn more?  Visit a travel medicine clinic or speak with a health care provider who specializes in travel medicine as soon as you know your travel plans.  Check the Travelers' Health section on the website of the Centers for Disease Control and Prevention (CDC): https://barron.com/http://wwwnc.cdc.gov/travel This information is not intended to replace advice given to you by your health care  provider. Make sure you discuss any questions you have with your health care provider. Document Released: 04/30/2002 Document Revised: 07/07/2015 Document Reviewed: 05/17/2013 Elsevier Interactive Patient Education  2018 [employer]    IF you received an x-ray today, you will receive an invoice from [facility]. Please contact [facility] at [phone] with questions or concerns regarding your invoice.   IF you received labwork today, you will receive an invoice from [facility]. Please contact [facility] at [phone] with questions or concerns regarding your invoice.   Our billing staff will not be able to assist you with questions regarding bills from these companies.  You will be contacted with the lab results as soon as they are available. The fastest way to get your results is to activate your My Chart account. Instructions are located on the last page of this paperwork. If you have not heard from us regarding the results in 2 weeks, please contact this office.

## 2017-08-07 NOTE — Progress Notes (Signed)
ZARYAN YAKUBOV  MRN: [id number] DOB: [birth year]  Subjective:  Gary Dean is a 52 y.o. male seen in office today for a chief complaint of need of travel immunizations and establish care.  Patient is traveling to [place] in 6 days.  He received all recommended childhood vaccines. He has not had Tdap vaccine in >10 years.  Needs hepatitis A vaccine.  He is visiting the [place] in [place] and staying on site.  Notes that the [facility] has planned most of their meals and activities.  He is not going to have high exposure to animals during his travel.  He will not be camping.  He did not plan to eat any street food or unbothered water.  He is overall healthy. 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 Last annual physical was 2017. Denies smoking.   Review of Systems  Constitutional: Negative for chills, diaphoresis and fever.  HENT: Negative for congestion.   Respiratory: Negative for cough and shortness of breath.   Cardiovascular: Negative for chest pain.  Gastrointestinal: Negative for abdominal pain, nausea and vomiting.    Patient Active Problem List   Diagnosis Date Noted  . GERD (gastroesophageal reflux disease) 01/29/2014  . Seborrheic dermatitis of scalp 01/29/2014    Current Outpatient Medications on File Prior to Visit  Medication Sig Dispense Refill  . amcinonide (CYCLOCORT) 0.1 % lotion Apply topically 2 (two) times daily. 60 mL 0  . cetirizine (ZYRTEC) 10 MG tablet Take 10 mg by mouth daily.    . fluticasone (FLONASE) 50 MCG/ACT nasal spray Place 2 sprays into both nostrils daily. 16 g 6  . ketoconazole (NIZORAL) 2 % shampoo Apply topically 2 (two) times a week. 120 mL 11  . ranitidine (ZANTAC) 150 MG tablet Take 150 mg by mouth 2 (two) times daily.    . cyclobenzaprine (FLEXERIL) 10 MG tablet Take 1 tablet (10 mg total) by mouth 3 (three) times daily as needed for muscle spasms. (Patient not taking: Reported on 08/07/2017) 30 tablet 0  . hydrOXYzine  (ATARAX/VISTARIL) 50 MG tablet Take 1-2 tablets (50-100 mg total) by mouth at bedtime. (Patient not taking: Reported on 08/07/2017) 60 tablet 0   No current facility-administered medications on file prior to visit.     No Known Allergies   Objective:  BP 122/76 (BP Location: Left Arm, Patient Position: Sitting, Cuff Size: Normal)   Pulse (!) 103   Temp 98.7 F (37.1 C) (Oral)   Resp 18   Ht 5' 10.47" (1.79 m)   Wt 155 lb 12.8 oz (70.7 kg)   SpO2 99%   BMI 22.06 kg/m   Physical Exam  Constitutional: He is oriented to person, place, and time. He appears well-developed and well-nourished. No distress.  HENT:  Head: Normocephalic and atraumatic.  Eyes: Conjunctivae are normal.  Neck: Normal range of motion.  Cardiovascular: Normal rate, regular rhythm and normal heart sounds.  Pulmonary/Chest: Effort normal and breath sounds normal. He has no wheezes. He has no rales.  Neurological: He is alert and oriented to person, place, and time.  Skin: Skin is warm and dry.  Psychiatric: He has a normal mood and affect.  Vitals reviewed.   Assessment and Plan :  1. Immunization counseling Per CDC travel guidelines for [place], it is recommend to have all routine vaccinations(pt needs update Tdap) and hepatitis A. Recommended typhoid esp if staying with friends or relatives or travelling to rural ares.  Unfortunately, we can only prescribe  vivotif, which has to be refrigerated and completed 1 week before travel.  Discussed with the patient.  Recommended he contact a local travel clinic or health department to see if he can get the injection of typhoid before travel.  He is not planning on eating or drinking any street food or visiting any rural area. Return in 6 months for 2nd hep A vaccine.  - Tdap vaccine greater than or equal to 7yo IM - Hepatitis A vaccine adult IM  2. Encounter to establish care I am happy to take over pt's care. Plan to follow up for CPE when he is ready.   Benjiman CoreBrittany  Bunny Lowdermilk PA-C  [facility] 08/07/2017 12:01 PM

## 2019-05-02 ENCOUNTER — Ambulatory Visit: Payer: Self-pay | Attending: Family

## 2019-05-02 DIAGNOSIS — Z23 Encounter for immunization: Secondary | ICD-10-CM

## 2019-05-02 NOTE — Progress Notes (Signed)
   Covid-19 Vaccination Clinic  Name:  Gary Dean    MRN: [id number] DOB: [birth year]  05/02/2019  [name] was observed post Covid-19 immunization for 15 minutes without incident. He was provided with Vaccine Information Sheet and instruction to access the V-Safe system.   [name] was instructed to call 911 with any severe reactions post vaccine: Marland Kitchen Difficulty breathing  . Swelling of face and throat  . A fast heartbeat  . A bad rash all over body  . Dizziness and weakness   Immunizations Administered    Name Date Dose VIS Date Route   Moderna COVID-19 Vaccine 05/02/2019  4:13 PM 0.5 mL 01/22/2019 Intramuscular   Manufacturer: Moderna   Lot: [id number]   NDC: 72091-068-16

## 2019-06-04 ENCOUNTER — Ambulatory Visit: Payer: Self-pay | Attending: Family

## 2019-06-04 DIAGNOSIS — Z23 Encounter for immunization: Secondary | ICD-10-CM

## 2019-06-04 NOTE — Progress Notes (Signed)
   Covid-19 Vaccination Clinic  Name:  Gary Dean    MRN: [id number] DOB: 11/09/1965  06/04/2019  Mr. [name] was observed post Covid-19 immunization for 15 minutes without incident. He was provided with Vaccine Information Sheet and instruction to access the V-Safe system.   Mr. Stock was instructed to call 911 with any severe reactions post vaccine: Marland Kitchen Difficulty breathing  . Swelling of face and throat  . A fast heartbeat  . A bad rash all over body  . Dizziness and weakness   Immunizations Administered    Name Date Dose VIS Date Route   Moderna COVID-19 Vaccine 06/04/2019  3:29 PM 0.5 mL 01/22/2019 Intramuscular   Manufacturer: Moderna   Lot: [id number]   NDC: 90300-923-30

## 2021-01-25 ENCOUNTER — Telehealth: Payer: Self-pay | Admitting: Hematology and Oncology

## 2021-01-25 NOTE — Telephone Encounter (Signed)
Scheduled appt per 12/5 referral. Pt is aware of appt date and time.  

## 2021-02-04 ENCOUNTER — Telehealth: Payer: Self-pay | Admitting: Oncology

## 2021-02-04 NOTE — Telephone Encounter (Signed)
Gary Dean and rescheduled appointment with Dr. Bertis Ruddy tomorrow to 10:20.  Advised him to arrive early to check in.  He verbalized understanding and agreement.

## 2021-02-05 ENCOUNTER — Encounter: Payer: Self-pay | Admitting: Hematology and Oncology

## 2021-02-05 ENCOUNTER — Inpatient Hospital Stay: Payer: BC Managed Care – PPO | Attending: Hematology and Oncology | Admitting: Hematology and Oncology

## 2021-02-05 ENCOUNTER — Other Ambulatory Visit: Payer: Self-pay

## 2021-02-05 DIAGNOSIS — D72819 Decreased white blood cell count, unspecified: Secondary | ICD-10-CM | POA: Insufficient documentation

## 2021-02-05 DIAGNOSIS — D700 Congenital agranulocytosis: Secondary | ICD-10-CM | POA: Insufficient documentation

## 2021-02-05 DIAGNOSIS — R030 Elevated blood-pressure reading, without diagnosis of hypertension: Secondary | ICD-10-CM | POA: Diagnosis not present

## 2021-02-05 NOTE — Assessment & Plan Note (Signed)
He admits that his elevated blood pressure could be due to anxiety He will follow-up with his primary care doctor for this

## 2021-02-05 NOTE — Assessment & Plan Note (Signed)
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 We reviewed his blood work dated back to 2016 and his white count typically range between 3.0-3.7 He is completely asymptomatic We discussed general approach to rule out typical causes such as vitamin B12 deficiency, abnormal thyroid studies, autoimmune disorders or viral infection On the other hand, we can also use a conservative approach and not perform all this work-up as he is completely asymptomatic After much discussion, he is in agreement not to pursue further work-up If he is interested, he can get his primary care doctor to check vitamin B12 level, TSH, autoimmune screen, HIV and hepatitis C serology testing to rule out treatable causes We discussed the pattern of inheritance that chronic congenital leukopenia probably would run in his family He does not need long-term follow-up with me and he is in agreement

## 2021-02-05 NOTE — Progress Notes (Signed)
[facility] CONSULT NOTE  Patient Care Team: Dwain Sarna as PCP - General (Physician Assistant)  CHIEF COMPLAINTS/PURPOSE OF CONSULTATION:  Chronic leukopenia  HISTORY OF PRESENTING ILLNESS:  Gary Dean 55 y.o. male is here because of chronic leukopenia.  He was found to have abnormal CBC from recent routine blood work performed during his annual visit with primary care doctor I was not able to read the scan results but the patient thinks his white blood cell count then was around 3.2 I reviewed all the electronic records dated back to 2016 and his white count ranges between 3.0-3.7 He denies recent infection. The last prescription antibiotics was more than 3 months ago He has some minor oral nasal congestion and he usually take medications for this.  He denies cough, urinary frequency/urgency or dysuria, diarrhea, joint swelling/pain or abnormal skin rash.  He had no prior history or diagnosis of cancer. His age appropriate screening programs are up-to-date. The patient has no prior diagnosis of autoimmune disease and was not prescribed corticosteroids related products.  MEDICAL HISTORY:  Past Medical History:  Diagnosis Date   Allergy    Anemia    Chronic headaches    3-4 times a day   GERD (gastroesophageal reflux disease)    h/o bleeding gastric ulcer 2004   Ulcer     SURGICAL HISTORY: Past Surgical History:  Procedure Laterality Date   left knee arthroscopy      SOCIAL HISTORY: Social History   Socioeconomic History   Marital status: Married    Spouse name: Not on file   Number of children: 3   Years of education: Not on file   Highest education level: Not on file  Occupational History   Occupation: professor  Tobacco Use   Smoking status: Never   Smokeless tobacco: Never  Vaping Use   Vaping Use: Never used  Substance and Sexual Activity   Alcohol use: No   Drug use: No   Sexual activity: Yes  Other Topics Concern   Not  on file  Social History Narrative   Married - Jola Babinski   Children step daughter, daughter, adopted son   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  MEDICATIONS:  Current Outpatient Medications  Medication Sig Dispense Refill   famotidine (PEPCID) 20 MG tablet Take 20 mg by mouth daily.     cetirizine (ZYRTEC) 10 MG tablet Take 10 mg by mouth daily.     fluticasone (FLONASE) 50 MCG/ACT nasal spray Place 2 sprays into both nostrils daily. 16 g 6   No current facility-administered medications for this visit.    REVIEW OF SYSTEMS:   Constitutional: Denies fevers, chills or abnormal night sweats Eyes: Denies blurriness of vision, double vision or watery eyes Ears, nose, mouth, throat, and face: Denies mucositis or sore throat Respiratory: Denies cough, dyspnea  or wheezes Cardiovascular: Denies palpitation, chest discomfort or lower extremity swelling Gastrointestinal:  Denies nausea, heartburn or change in bowel habits Skin: Denies abnormal skin rashes Lymphatics: Denies new lymphadenopathy or easy bruising Neurological:Denies numbness, tingling or new weaknesses Behavioral/Psych: Mood is stable, no new changes  All other systems were reviewed with the patient and are negative.  PHYSICAL EXAMINATION: ECOG PERFORMANCE STATUS: 0 - Asymptomatic  Vitals:   02/05/21 1021  BP: (!) 132/102  Pulse: (!) 105  Resp: 18  Temp: 97.7 F (36.5  C)  SpO2: 100%   Filed Weights   02/05/21 1021  Weight: 159 lb 9.6 oz (72.4 kg)    GENERAL:alert, no distress and comfortable SKIN: skin color, texture, turgor are normal, no rashes or significant lesions EYES: normal, conjunctiva are pink and non-injected, sclera clear OROPHARYNX:no exudate, no erythema and lips, buccal mucosa, and tongue normal  NECK: supple, thyroid normal size, non-tender, without nodularity LYMPH:  no palpable lymphadenopathy in the cervical, axillary or inguinal LUNGS: clear to auscultation and percussion with normal breathing effort HEART: regular rate & rhythm and no murmurs and no lower extremity edema ABDOMEN:abdomen soft, non-tender and normal bowel sounds Musculoskeletal:no cyanosis of digits and no clubbing  PSYCH: alert & oriented x 3 with fluent speech NEURO: no focal motor/sensory deficits  LABORATORY DATA:  I have reviewed the data as listed  ASSESSMENT & PLAN  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 We reviewed his blood work dated back to 2016 and his white count typically range between 3.0-3.7 He is completely asymptomatic We discussed general approach to rule out typical causes such as vitamin B12 deficiency, abnormal thyroid studies, autoimmune disorders or viral infection On the other hand, we can also use a conservative approach and not perform all this work-up as he is completely asymptomatic After much discussion, he is in agreement not to pursue further work-up If he is interested, he can get his primary care doctor to check vitamin B12 level, TSH, autoimmune screen, HIV and hepatitis C serology testing to rule out treatable causes We discussed the pattern of inheritance that chronic congenital leukopenia probably would run in his family He does not need long-term follow-up with me and he is in agreement  Elevated BP without diagnosis of hypertension He admits that his elevated blood pressure could be due to  anxiety He will follow-up with his primary care doctor for this  No orders of the defined types were placed in this encounter.   All questions were answered. The patient knows to call the clinic with any problems, questions or concerns. I spent 55 minutes counseling the patient face to face and review test results.     Artis Delay, MD 02/05/2021 5:37 PM

## 2021-11-17 ENCOUNTER — Other Ambulatory Visit: Payer: Self-pay | Admitting: Urology

## 2021-11-17 DIAGNOSIS — R972 Elevated prostate specific antigen [PSA]: Secondary | ICD-10-CM

## 2021-12-08 ENCOUNTER — Ambulatory Visit
Admission: RE | Admit: 2021-12-08 | Discharge: 2021-12-08 | Disposition: A | Discharge status: Home / Self Care | Payer: BC Managed Care – PPO | Source: Ambulatory Visit | Attending: Urology | Admitting: Urology

## 2021-12-08 DIAGNOSIS — R972 Elevated prostate specific antigen [PSA]: Secondary | ICD-10-CM

## 2021-12-08 MED ORDER — GADOPICLENOL 0.5 MMOL/ML IV SOLN: 7.0000 mL | Freq: Once | INTRAVENOUS | Status: DC | PRN

## 2021-12-08 MED ORDER — GADOPICLENOL 0.5 MMOL/ML IV SOLN
7.0000 mL | Freq: Once | INTRAVENOUS | Status: AC | PRN
  Administered 2021-12-08: 7 mL via INTRAVENOUS

## 2023-02-23 DIAGNOSIS — C61 Malignant neoplasm of prostate: Secondary | ICD-10-CM | POA: Diagnosis not present

## 2023-03-25 DIAGNOSIS — J069 Acute upper respiratory infection, unspecified: Secondary | ICD-10-CM | POA: Diagnosis not present

## 2023-04-18 DIAGNOSIS — Z0000 Encounter for general adult medical examination without abnormal findings: Secondary | ICD-10-CM | POA: Diagnosis not present

## 2023-04-18 DIAGNOSIS — C61 Malignant neoplasm of prostate: Secondary | ICD-10-CM | POA: Diagnosis not present

## 2023-07-11 DIAGNOSIS — M79605 Pain in left leg: Secondary | ICD-10-CM | POA: Diagnosis not present

## 2023-07-14 DIAGNOSIS — M25562 Pain in left knee: Secondary | ICD-10-CM | POA: Diagnosis not present

## 2024-02-20 ENCOUNTER — Encounter (INDEPENDENT_AMBULATORY_CARE_PROVIDER_SITE_OTHER): Payer: Self-pay | Admitting: Physician Assistant

## 2024-02-20 ENCOUNTER — Ambulatory Visit (INDEPENDENT_AMBULATORY_CARE_PROVIDER_SITE_OTHER): Admitting: Physician Assistant

## 2024-02-20 VITALS — BP 122/83 | HR 61 | Temp 97.7°F | Ht 70.0 in | Wt 148.0 lb

## 2024-02-20 DIAGNOSIS — H9313 Tinnitus, bilateral: Secondary | ICD-10-CM | POA: Diagnosis not present

## 2024-02-20 NOTE — Patient Instructions (Signed)
 [facility] Tinn[facility]  [address] [place], [place] [address] Phone: [phone] Fax: [phone] Forms: [email or web address]

## 2024-02-20 NOTE — Progress Notes (Signed)
 Dear Dr. Rexanne, Here is my assessment for our mutual patient, Gary Dean. Thank you for allowing me the opportunity to care for your patient. Please do not hesitate to contact me should you have any other questions. Sincerely, Chyrl Cohen PA-C  Otolaryngology Clinic Note Referring provider: Dr. Rexanne HPI:  Gary Dean is a 58 y.o. male kindly referred by Dr. Rexanne   Discussed the use of AI scribe software for clinical note transcription with the patient, who gave verbal consent to proceed.  History of Present Illness   Gary Dean is a 58 year old male who presents with persistent bilateral tinnitus.  He reports several months of low-level, intermittent ringing in both ears, which was generally not noticeable unless he focused on it and did not interfere with daily activities.  Approximately six weeks ago, he experienced a sudden and significant increase in the loudness of the tinnitus upon awakening, affecting both ears. Since then, the ringing has remained markedly elevated with only minor fluctuations and is now distracting, particularly impacting his ability to sleep.  He denies otalgia, history of ear trauma, or recurrent otitis media. He has no significant history of noise exposure and works in an office environment. He is uncertain about the presence of hearing loss, as he and his wife often keep the television volume high, making it difficult to discern any impairment.  He has not started any new medications around the time of symptom onset. Following a visit with his primary care provider, he increased his allergy medications to the maximum prescribed dose for several days without improvement in symptoms.           Independent Review of Additional Tests or Records:  none   PMH/Meds/All/SocHx/FamHx/ROS:   Past Medical History:  Diagnosis Date   Allergy    Anemia    Chronic headaches    3-4 times a day   GERD (gastroesophageal reflux disease)    h/o bleeding  gastric ulcer 2004   Ulcer      Past Surgical History:  Procedure Laterality Date   left knee arthroscopy      Family History  Problem Relation Age of Onset   Asthma Mother    Hypertension Mother    Stroke Maternal Grandmother    Mental illness Paternal Grandmother    Stroke Paternal Grandfather      Social Connections: Not on file     Current Medications[1]   Physical Exam:   BP 122/83   Pulse 61   Temp 97.7 F (36.5 C)   Ht 5' 10 (1.778 m)   Wt 148 lb (67.1 kg)   SpO2 92%   BMI 21.24 kg/m   Pertinent Findings  CN II-XII grossly intact Bilateral EAC clear and TM intact with well pneumatized middle ear spaces Weber 512: equal Rinne 512: AC > BC b/l  Anterior rhinoscopy: Septum left deviation ; bilateral inferior turbinates with no hypertrophy No lesions of oral cavity/oropharynx; dentition WNL No obviously palpable neck masses/lymphadenopathy/thyromegaly No respiratory distress or stridor       Seprately Identifiable Procedures:  None  Impression & Plans:  Gary Dean is a 58 y.o. male with the following   Assessment and Plan    Bilateral tinnitus Subacute, moderate bilateral tinnitus with acute worsening six weeks ago, not significantly impacting sleep. No clear etiology identified.  - Ordered audiogram to assess for hearing loss and exclude asymmetric or concerning etiologies. - Provided information for the tinnitus clinic at [facility] for further coping strategies and  management if symptoms persist. - Discussed use of background noise (e.g., noise machine, fan) at night to mask tinnitus and improve sleep. - Will review audiogram results and follow up regarding findings and further recommendations.           - f/u phone call with audio results    Thank you for allowing me the opportunity to care for your patient. Please do not hesitate to contact me should you have any other questions.  Sincerely, Chyrl Cohen PA-C [facility] Phone: [phone] Fax: [phone]  02/20/2024, 1:57 PM        [1]  Current Outpatient Medications:    azelastine (OPTIVAR) 0.05 % ophthalmic solution, Place 1 drop into both eyes as needed., Disp: , Rfl:    Azelastine HCl 137 MCG/SPRAY SOLN, Place 2 sprays into both nostrils 2 (two) times daily., Disp: , Rfl:    cetirizine (ZYRTEC) 10 MG tablet, Take 10 mg by mouth daily., Disp: , Rfl:    diphenhydrAMINE (BENADRYL) 25 mg capsule, Take 25 mg by mouth as needed., Disp: , Rfl:    famotidine (PEPCID) 20 MG tablet, Take 20 mg by mouth daily., Disp: , Rfl:    fluticasone  (FLONASE ) 50 MCG/ACT nasal spray, Place 2 sprays into both nostrils daily., Disp: 16 g, Rfl: 6   Misc Natural Products (BEET ROOT PO), Take 2 capsules by mouth daily., Disp: , Rfl:    montelukast (SINGULAIR) 10 MG tablet, Take 10 mg by mouth as needed., Disp: , Rfl:    Multiple Vitamin (MULTI-VITAMIN) tablet, Take 1 tablet by mouth daily., Disp: , Rfl:    tamsulosin (FLOMAX) 0.4 MG CAPS capsule, Take 0.4 mg by mouth daily., Disp: , Rfl:

## 2024-03-05 ENCOUNTER — Ambulatory Visit (INDEPENDENT_AMBULATORY_CARE_PROVIDER_SITE_OTHER): Admitting: Audiology

## 2024-03-05 DIAGNOSIS — Z01118 Encounter for examination of ears and hearing with other abnormal findings: Secondary | ICD-10-CM | POA: Diagnosis not present

## 2024-03-05 DIAGNOSIS — Z0110 Encounter for examination of ears and hearing without abnormal findings: Secondary | ICD-10-CM

## 2024-03-05 DIAGNOSIS — H93293 Other abnormal auditory perceptions, bilateral: Secondary | ICD-10-CM | POA: Diagnosis not present

## 2024-03-05 NOTE — Progress Notes (Signed)
" °  [address] [place], [place] [address] [phone]  Audiological Evaluation    Name: Gary Dean     DOB:   10/09/1965      MRN:   [id number]                                                                                     Service Date: 03/05/2024     Accompanied by: self    Patient comes today after Reyes Cohen, PA-C sent a referral for a hearing evaluation due to concerns with tinnitus.   Symptoms Yes Details  Hearing loss  []    Tinnitus  [x]  Both ears - bothersome at night  Onset November 2025  Ear pain/ infections/pressure  []    Balance problems  []    Noise exposure history  [x]  Concerts - reports was at one a couple months before he started noticing the tinnitus. After the concert he noticed his hearing was muffled and/or tinnitus, which seemed to go back to baseline.  Previous ear surgeries  []    Family history of hearing loss  []    Amplification  []    Other  []      Otoscopy: Right ear: Clear external ear canal and notable landmarks visualized on the tympanic membrane. Left ear:  Clear external ear canal and notable landmarks visualized on the tympanic membrane.  Tympanometry: Right ear: Type C - Normal external ear canal volume with negative middle ear pressure and normal or reduced tympanic membrane compliance. Findings are consistent with Eustachian tube dysfunction.   Left ear: Type A - Normal external ear canal volume with normal middle ear pressure and normal tympanic membrane compliance. Findings are consistent with normal middle ear function.  Hearing Evaluation The hearing test results were completed under headphones and results are deemed to be of good reliability. Test technique:  conventional    Pure tone Audiometry: Both ears-  Normal hearing from [phone], slightly worse in the right ear.  Speech Audiometry: Right ear- Speech Reception Threshold (SRT) was obtained at 5 dBHL. Left ear-Speech Reception Threshold (SRT) was  obtained at 0 dBHL.   Word Recognition Score Tested using NU-6 (recorded) Right ear: 100% was obtained at a presentation level of 50 dBHL with contralateral masking which is deemed as  excellent. Left ear: 100% was obtained at a presentation level of 50 dBHL with contralateral masking which is deemed as  excellent.   Recommendations: Follow up with ENT as scheduled. Consider various tinnitus strategies, including the use of a sound generator, and/or tinnitus retraining therapy.    Traci Plemons MARIE LEROUX-MARTINEZ, AUD  "

## 2024-03-07 ENCOUNTER — Telehealth (INDEPENDENT_AMBULATORY_CARE_PROVIDER_SITE_OTHER): Payer: Self-pay | Admitting: Physician Assistant

## 2024-03-07 NOTE — Telephone Encounter (Signed)
 Audiological evaluation reviewed he has very minimal asymmetry right greater than left, this is less than 10 dB difference at multiple frequencies.  I do not think pursuing MRI would be necessary at this time, rather repeat audiological evaluation in 1 year if he has any new or worsening signs or symptoms in the meantime.  I attempted to call the patient he was unavailable I did leave a voicemail for him to call back.

## 2024-03-07 NOTE — Telephone Encounter (Signed)
 Patient called and left voicemail 03/07/2024 at 1:10 PM. Patient had called stating he was returning PA Hedges phone call.

## 2024-03-08 ENCOUNTER — Telehealth (INDEPENDENT_AMBULATORY_CARE_PROVIDER_SITE_OTHER): Payer: Self-pay | Admitting: Physician Assistant

## 2024-03-08 NOTE — Telephone Encounter (Signed)
 thanks

## 2024-03-08 NOTE — Telephone Encounter (Signed)
 I spoke to Gary Dean today about his audiological evaluation.  Please see previous note for recommendations.  He will follow-up PRN.

## 2024-03-15 ENCOUNTER — Encounter: Payer: Self-pay | Admitting: Audiology
